# Patient Record
Sex: Female | Born: 1990
Health system: Southern US, Community
[De-identification: ages and names within clinical notes are randomized; demographics above are authoritative.]

## PROBLEM LIST (undated history)

## (undated) DIAGNOSIS — D649 Anemia, unspecified: Secondary | ICD-10-CM

## (undated) DIAGNOSIS — T7840XA Allergy, unspecified, initial encounter: Secondary | ICD-10-CM

## (undated) HISTORY — DX: Allergy, unspecified, initial encounter: T78.40XA

## (undated) HISTORY — DX: Anemia, unspecified: D64.9

---

## 2015-02-10 ENCOUNTER — Ambulatory Visit (INDEPENDENT_AMBULATORY_CARE_PROVIDER_SITE_OTHER): Admitting: Internal Medicine

## 2015-02-10 VITALS — BP 110/82 | HR 82 | Temp 98.1°F | Resp 20 | Ht 66.0 in | Wt 130.0 lb

## 2015-02-10 DIAGNOSIS — R059 Cough, unspecified: Secondary | ICD-10-CM

## 2015-02-10 DIAGNOSIS — R05 Cough: Secondary | ICD-10-CM

## 2015-02-10 DIAGNOSIS — J301 Allergic rhinitis due to pollen: Secondary | ICD-10-CM

## 2015-02-10 MED ORDER — CETIRIZINE HCL 10 MG PO TABS: 10.0000 mg | ORAL_TABLET | Freq: Every day | ORAL | Status: DC

## 2015-02-10 MED ORDER — MONTELUKAST SODIUM 10 MG PO TABS: 10.0000 mg | ORAL_TABLET | Freq: Every day | ORAL | Status: DC

## 2015-02-10 MED ORDER — PROMETHAZINE-DM 6.25-15 MG/5ML PO SYRP: 5.0000 mL | ORAL_SOLUTION | Freq: Four times a day (QID) | ORAL | Status: DC | PRN

## 2015-02-10 MED ORDER — FLUTICASONE PROPIONATE 50 MCG/ACT NA SUSP: NASAL | Status: DC

## 2015-02-10 NOTE — Progress Notes (Signed)
Subjective:  This chart was scribed for Tonye Pearson, MD by Charline Bills, ED Scribe. The patient was seen in room 11. Patient's care was started at 2:52 PM.   Patient ID: Linda Yates, female    DOB: 09-08-1991, 24 y.o.   MRN: [id number]  Chief Complaint  Patient presents with  . Cough    x 1 week  . Nasal Congestion    x 1 week   HPI HPI Comments: Linda Yates is a 24 y.o. female who presents to the [facility] complaining of gradually worsening productive cough with yellow mucous, worsened at night, for the past week. Pt reports h/o sneezing, congestion, chest tightness onset earlier today, intermittent HA. She denies nosebleeds, SOB, ear pain, eye itching, sinus pressure. No h/o asthma. Pt started Mucinex DM yesterday. Pt has tried Prednisone in the past which caused insomnia.   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  Past Medical History  Diagnosis Date  . Allergy    No current outpatient prescriptions on file prior to visit.   No current facility-administered medications on file prior to visit.   No Known Allergies  Review of Systems  HENT: Positive for congestion and sneezing. Negative for ear pain, nosebleeds and sinus pressure.   Eyes: Negative for itching.  Respiratory: Positive for cough and chest tightness. Negative for shortness of breath.   Neurological: Positive for headaches.      Objective:   Physical Exam  Constitutional: She is oriented to person, place, and time. She appears well-developed and well-nourished. No distress.  HENT:  Head: Normocephalic and atraumatic.  Right Ear: Tympanic membrane and ear canal normal.  Left Ear: Tympanic membrane and ear canal normal.  Nose: Mucosal edema present.  Mouth/Throat: Oropharynx is clear and moist.  Nares: Swollen irritated turbinates with copious discharge.   Eyes: EOM are normal. Right conjunctiva is injected. Left  conjunctiva is injected.  Neck: Neck supple.  Cardiovascular: Normal rate, regular rhythm and normal heart sounds.   No murmur heard. Pulmonary/Chest: Effort normal and breath sounds normal.  Musculoskeletal: Normal range of motion.  Lymphadenopathy:    She has no cervical adenopathy.  Neurological: She is alert and oriented to person, place, and time.  Skin: Skin is warm and dry.  Psychiatric: She has a normal mood and affect. Her behavior is normal.  Nursing note and vitals reviewed. BP 110/82 mmHg  Pulse 82  Temp(Src) 98.1 F (36.7 C) (Oral)  Resp 20  Ht  (1.676 m)  Wt 130 lb (58.968 kg)  BMI 20.99 kg/m2  SpO2 100%  LMP 01/20/2015    Assessment & Plan:   I have completed the patient encounter in its entirety as documented by the scribe, with editing by me where necessary. Linda Yates, M.D. Allergic rhinitis due to pollen  Cough  Meds ordered this encounter  Medications  . cetirizine (ZYRTEC) 10 MG tablet    Sig: Take 1 tablet (10 mg total) by mouth daily.    Dispense:  30 tablet    Refill:  11  . montelukast (SINGULAIR) 10 MG tablet    Sig: Take 1 tablet (10 mg total) by mouth at bedtime.    Dispense:  30 tablet    Refill:  2  . fluticasone (FLONASE) 50 MCG/ACT nasal spray    Sig: 1 spray in each nostril twice a day for the next 2-3 months    Dispense:  16 g    Refill:  6  . promethazine-dextromethorphan (PROMETHAZINE-DM) 6.25-15 MG/5ML syrup    Sig: Take 5 mLs by mouth 4 (four) times daily as needed for cough.    Dispense:  118 mL    Refill:  0   Follow-up in one month to reevaluate nasal exam and control of allergies

## 2015-02-10 NOTE — Patient Instructions (Signed)
You may also use Sudafed 12 hour which the pharmacist keeps behind the counter for you to sign for. This will relieve nasal congestion and can be used at breakfast and dinner unless it also causes you to feel restless or have a rapid heartbeat as you try to go to bed. You might consider using "Netty Pot" nasal saline administration-information is available on the web.

## 2015-02-15 ENCOUNTER — Other Ambulatory Visit: Payer: Self-pay | Admitting: Internal Medicine

## 2015-02-16 ENCOUNTER — Encounter (HOSPITAL_COMMUNITY): Payer: Self-pay | Admitting: *Deleted

## 2015-02-16 ENCOUNTER — Emergency Department (HOSPITAL_COMMUNITY)
Admission: EM | Admit: 2015-02-16 | Discharge: 2015-02-16 | Disposition: A | Discharge status: Home / Self Care | Attending: Emergency Medicine | Admitting: Emergency Medicine

## 2015-02-16 ENCOUNTER — Emergency Department (HOSPITAL_COMMUNITY)

## 2015-02-16 DIAGNOSIS — B349 Viral infection, unspecified: Secondary | ICD-10-CM | POA: Diagnosis not present

## 2015-02-16 DIAGNOSIS — Z79899 Other long term (current) drug therapy: Secondary | ICD-10-CM | POA: Insufficient documentation

## 2015-02-16 DIAGNOSIS — R05 Cough: Secondary | ICD-10-CM | POA: Diagnosis present

## 2015-02-16 LAB — BASIC METABOLIC PANEL
Anion gap: 7 (ref 5–15)
BUN: 12 mg/dL (ref 6–23)
CALCIUM: 9 mg/dL (ref 8.4–10.5)
CO2: 24 mmol/L (ref 19–32)
CREATININE: 0.68 mg/dL (ref 0.50–1.10)
Chloride: 106 mmol/L (ref 96–112)
GFR calc Af Amer: >90 mL/min (ref >90)
GFR calc non Af Amer: >90 mL/min (ref >90)
GLUCOSE: 92 mg/dL (ref 70–99)
POTASSIUM: 3.6 mmol/L (ref 3.5–5.1)
SODIUM: 137 mmol/L (ref 135–145)

## 2015-02-16 LAB — CBC
HCT: 34.8 % — ABNORMAL LOW (ref 36.0–46.0)
Hemoglobin: 10.7 g/dL — ABNORMAL LOW (ref 12.0–15.0)
MCH: 21.7 pg — AB (ref 26.0–34.0)
MCHC: 30.7 g/dL (ref 30.0–36.0)
MCV: 70.6 fL — ABNORMAL LOW (ref 78.0–100.0)
PLATELETS: 359 10*3/uL (ref 150–400)
RBC: 4.93 MIL/uL (ref 3.87–5.11)
RDW: 14.2 % (ref 11.5–15.5)
WBC: 11.1 10*3/uL — AB (ref 4.0–10.5)

## 2015-02-16 MED ORDER — PROMETHAZINE-DM 6.25-15 MG/5ML PO SYRP: 5.0000 mL | ORAL_SOLUTION | Freq: Four times a day (QID) | ORAL | Status: DC | PRN

## 2015-02-16 NOTE — ED Provider Notes (Signed)
CSN: [id number]     Arrival date & time 02/16/15  1825 History   First MD Initiated Contact with Patient 02/16/15 1909     Chief Complaint  Patient presents with  . Cough  . 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 Patient reports for the past week she has had nasal congestion, teary eyes, runny nose, and nonproductive cough with associate shortness of breath. Cough worsening at nighttime. She was seen at an urgent care center several days ago for this complaint and was given symptomatic treatment. She has been using her cough medication which has helped but she has ran out of her cough medication. She also reported having dyspnea on exertion. Patient denies prior history of PE or DVT, no recent surgery, recent travel, prolonged bed rest, unilateral leg swelling or calf pain, active cancer.  She is a nonsmoker without any significant cardiac history. No history of asthma, denies wheezing.  Past Medical History  Diagnosis Date  . Allergy    History reviewed. No pertinent past surgical history. History reviewed. No pertinent family history. History  Substance Use Topics  . Smoking status: Never Smoker   . Smokeless tobacco: Not on file  . Alcohol Use: Not on file   OB History    No data available     Review of Systems  All other systems reviewed and are negative.     Allergies  Review of patient's allergies indicates no known allergies.  Home Medications   Prior to Admission medications   Medication Sig Start Date End Date Taking? Authorizing Provider  cetirizine (ZYRTEC) 10 MG tablet Take 1 tablet (10 mg total) by mouth daily. 02/10/15  Yes Tonye Pearson, MD  fluticasone [facility]) 50 MCG/ACT nasal spray 1 spray in each nostril twice a day for the next 2-3 months 02/10/15  Yes Tonye Pearson, MD   montelukast (SINGULAIR) 10 MG tablet Take 1 tablet (10 mg total) by mouth at bedtime. 02/10/15  Yes Tonye Pearson, MD  promethazine-dextromethorphan (PROMETHAZINE-DM) 6.25-15 MG/5ML syrup Take 5 mLs by mouth 4 (four) times daily as needed for cough. Patient not taking: Reported on 02/16/2015 02/10/15   Tonye Pearson, MD   BP 122/77 mmHg  Pulse 89  Temp(Src) 98.1 F (36.7 C) (Oral)  Resp 16  SpO2 100%  LMP 01/20/2015 Physical Exam  Constitutional: She appears well-developed and well-nourished. No distress.  HENT:  Head: Atraumatic.  Right Ear: External ear normal.  Left Ear: External ear normal.  Nose: Nose normal.  Mouth/Throat: Oropharynx is clear and moist. No oropharyngeal exudate.  Eyes: Conjunctivae are normal.  Neck: Neck supple.  No nuchal rigidity  Cardiovascular: Normal rate, regular rhythm and intact distal pulses.  Exam reveals no gallop and no friction rub.   No murmur heard. Pulmonary/Chest: Effort normal and breath sounds normal. No respiratory distress. She has no wheezes.  Abdominal: Soft. There is no tenderness.  Musculoskeletal: She exhibits no edema.  Neurological: She is alert.  Skin: No rash noted.  Psychiatric: She has a normal mood and affect.  Nursing note and vitals reviewed.   ED Course  Procedures (including critical care time)  7:38 PM Pt with URI sxs.  PERC negative, doubt PE.  sxs does not suggest ACS.  She is afebrile, VSS.    9:44 PM Chest x-ray and labs are reassuring. At this time I felt patient stable  for discharge. Patient will be receiving cough medication as it has helped in the past. Return precautions discussed.  Labs Review Labs Reviewed  CBC - Abnormal; Notable for the following:    WBC 11.1 (*)    Hemoglobin 10.7 (*)    HCT 34.8 (*)    MCV 70.6 (*)    MCH 21.7 (*)    All other components within normal limits  BASIC METABOLIC PANEL    Imaging Review Dg Chest 2 View (if Patient Has Fever And/or Copd)  02/16/2015    CLINICAL DATA:  Shortness of breath, cough, congestion  EXAM: CHEST  2 VIEW  COMPARISON:  None.  FINDINGS: The heart size and mediastinal contours are within normal limits. Both lungs are clear. The visualized skeletal structures are unremarkable.  IMPRESSION: No active cardiopulmonary disease.   Electronically Signed   By: Christiana PellantGretchen  Green M.D.   On: 02/16/2015 20:19     EKG Interpretation None      MDM   Final diagnoses:  Viral syndrome    BP 95/48 mmHg  Pulse 83  Temp(Src) 98.1 F (36.7 C) (Oral)  Resp 18  SpO2 99%  LMP 01/20/2015  I have reviewed nursing notes and vital signs. I personally reviewed the imaging tests through PACS system  I reviewed available ER/hospitalization records thought the EMR     Fayrene HelperBowie Cary Lothrop, PA-C 02/16/15 2146  Gilda Creasehristopher J Pollina, MD 02/16/15 2151

## 2015-02-16 NOTE — Discharge Instructions (Signed)

## 2015-02-16 NOTE — ED Notes (Signed)
Pt reports she was seen and treated in ED on 4/5 for cough, reports she is still having cough and now having SOB when walking long distances. Denies pain. Able to speak in full sentences.

## 2015-02-17 ENCOUNTER — Other Ambulatory Visit: Payer: Self-pay | Admitting: Internal Medicine

## 2016-02-17 ENCOUNTER — Other Ambulatory Visit: Payer: Self-pay | Admitting: Internal Medicine

## 2016-06-08 IMAGING — CR DG CHEST 2V
2 series · 2 of 2 positions shown · non-contrast
Comparison: None.

CLINICAL DATA: Shortness of breath, cough, congestion

EXAM:
CHEST  2 VIEW

[w chest pa]
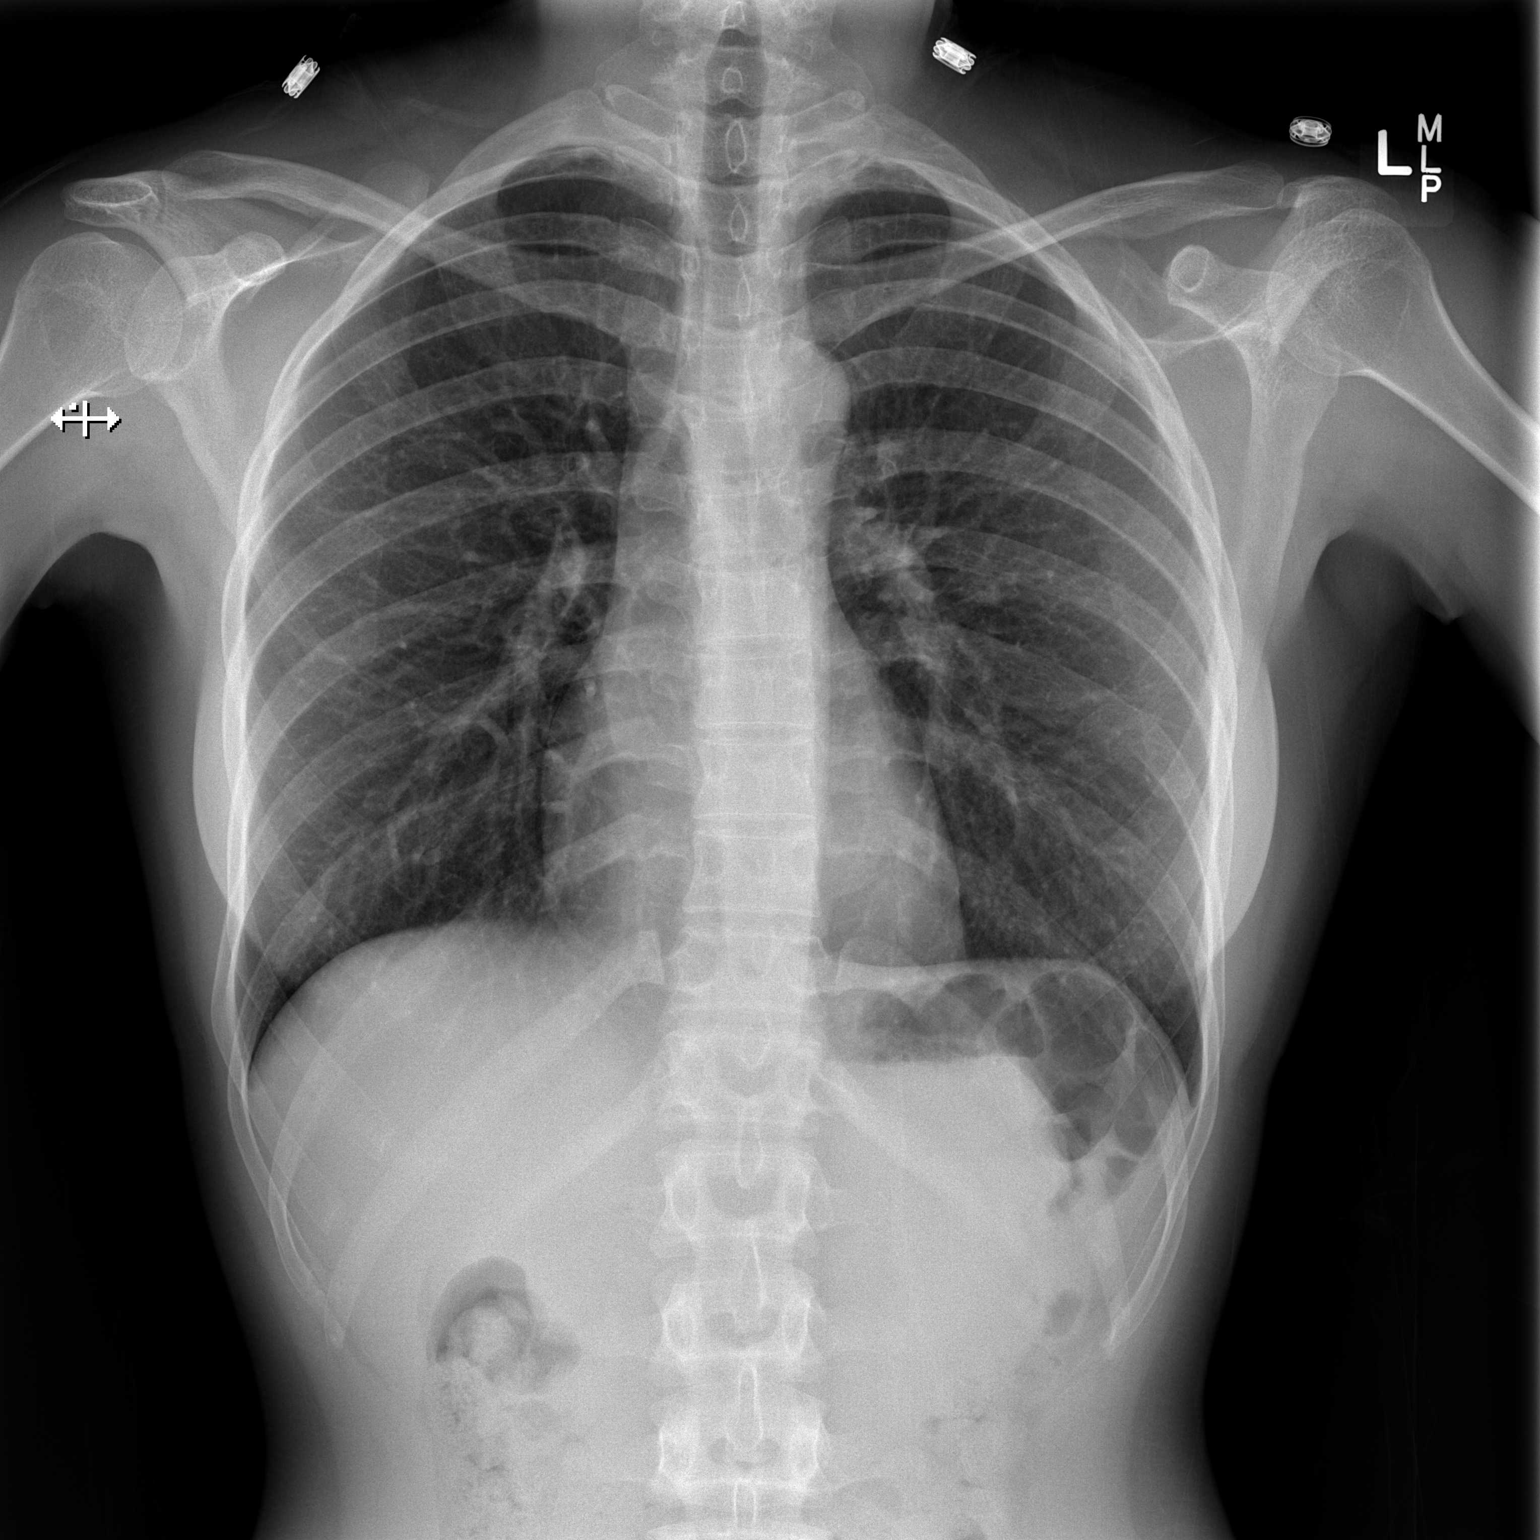

[w chest lat]
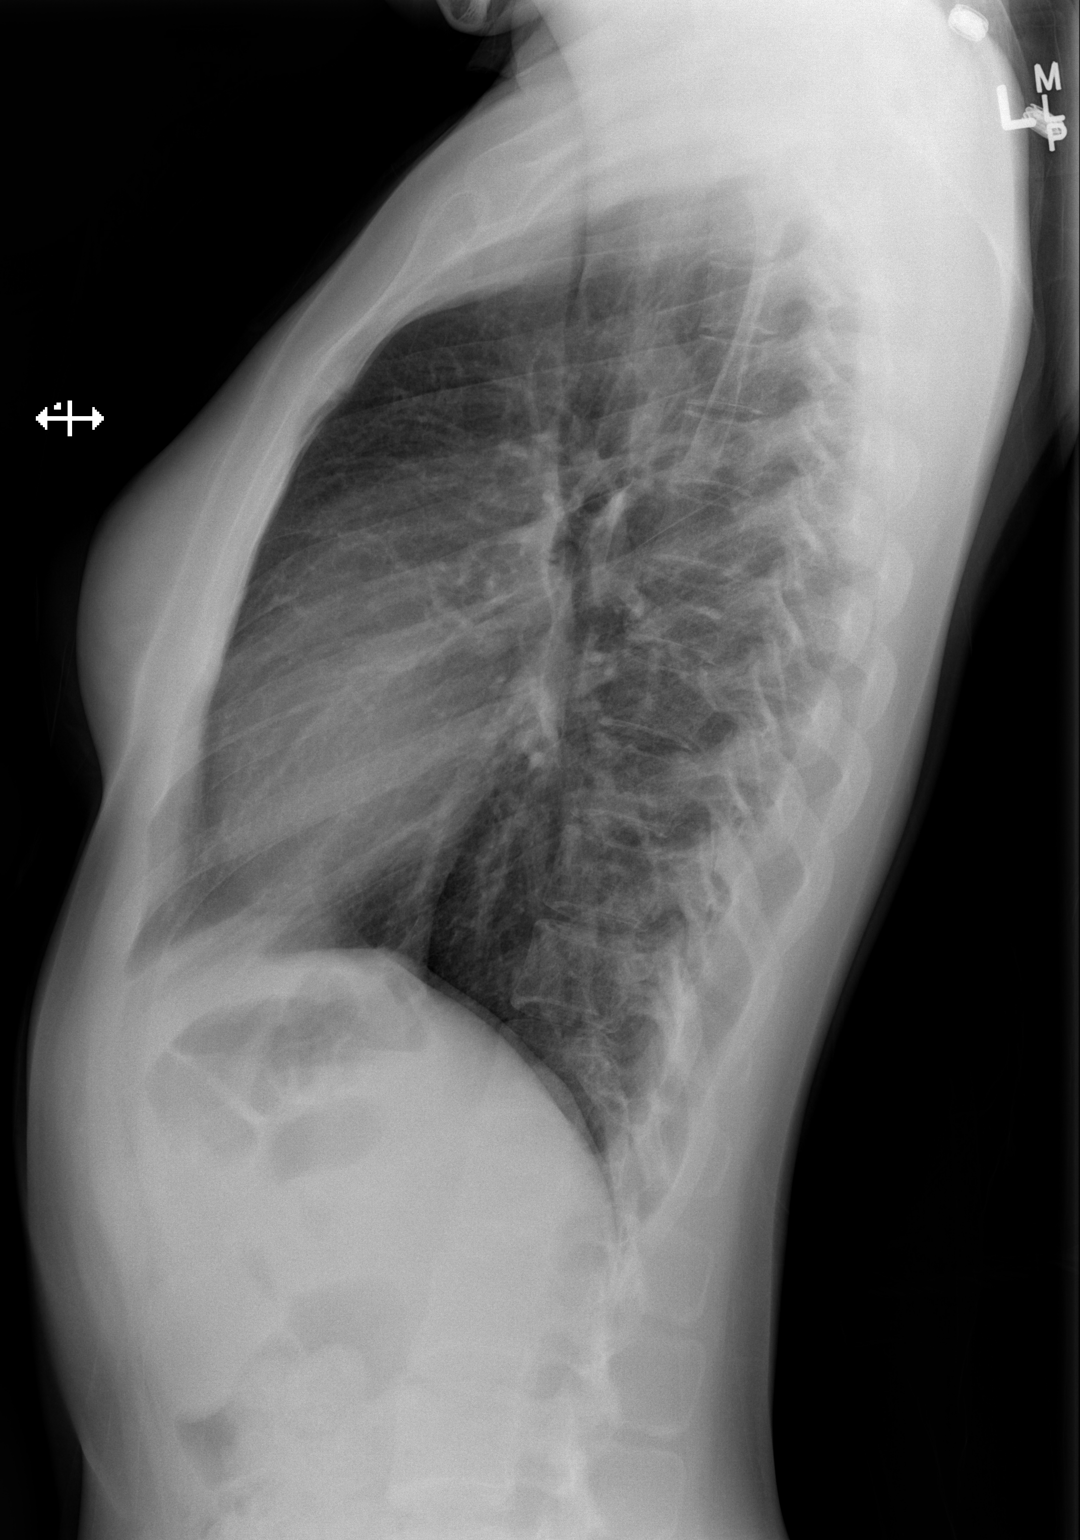

[2 of 2 positions shown; findings below may reference images not displayed]

FINDINGS: The heart size and mediastinal contours are within normal limits.
Both lungs are clear. The visualized skeletal structures are
unremarkable.
IMPRESSION: No active cardiopulmonary disease.

## 2016-10-26 ENCOUNTER — Ambulatory Visit (INDEPENDENT_AMBULATORY_CARE_PROVIDER_SITE_OTHER): Payer: BLUE CROSS/BLUE SHIELD | Admitting: Family Medicine

## 2016-10-26 VITALS — BP 110/70 | HR 79 | Temp 98.4°F | Resp 18 | Ht 65.0 in | Wt 126.2 lb

## 2016-10-26 DIAGNOSIS — R69 Illness, unspecified: Secondary | ICD-10-CM

## 2016-10-26 DIAGNOSIS — J111 Influenza due to unidentified influenza virus with other respiratory manifestations: Secondary | ICD-10-CM

## 2016-10-26 NOTE — Progress Notes (Signed)
Subjective:  By signing my name below, I, Raven Small, attest that this documentation has been prepared under the direction and in the presence of Meredith StaggersJeffrey Cecil Vandyke, MD.  Electronically Signed: Andrew Auaven Small, ED Scribe. 10/26/2016. 3:05 PM.   Patient ID: Linda Yates, female    DOB: 06/29/1991, 25 y.o.   MRN: [id number]  HPI Chief Complaint  Patient presents with  . Sinusitis    HPI Comments: Linda Yates is a 25 y.o. female who presents to the [facility] complaining of sinus congestion. Pt states symptoms started 2 days with HA and subjective fever, but did not measure a fever. Yesterday she began to feel feverish with body aches. Today she reports sweats this morning, HA sore throat, and decreased appetite, but no fever. Pt has tried alka seltzer cold and otc sinus medication without relief. She denies sick contacts. She deneis cough rhinorrhea, ear pain, decreased appetite, CP, SOB, hematuria, emesis, diarrhea and abdominal pain. Pt received fly shot 2 months ago.   Pt reports palpitation of heart fluttering in the past. She was seen by cardiologist.   Pt works as a Education officer, environmentalretail coordinator.   Patient Active Problem List   Diagnosis Date Noted  . Allergic rhinitis due to pollen 02/10/2015   Past Medical History:  Diagnosis Date  . Allergy    No past surgical history on file. Allergies  Allergen Reactions  . Prednisone    Prior to Admission medications   Not on File   Social History   Social History  . Marital status: Single    Spouse name: N/A  . Number of children: N/A  . Years of education: N/A   Occupational History  . Not on file.   Social History Main Topics  . Smoking status: Never Smoker  . Smokeless tobacco: Not on file  . Alcohol use Not on file  . Drug use: Unknown  . Sexual activity: Not on file   Other Topics Concern  . Not on file   Social History Narrative  . No narrative on file      Review of Systems  Constitutional:  Positive for chills, fatigue and fever. Negative for appetite change.  HENT: Positive for sore throat. Negative for ear pain and rhinorrhea.   Respiratory: Negative for cough and shortness of breath.   Cardiovascular: Negative for chest pain.  Gastrointestinal: Positive for nausea. Negative for abdominal pain and vomiting.  Genitourinary: Negative for hematuria.  Neurological: Positive for headaches.       Objective:   Physical Exam  Constitutional: She is oriented to person, place, and time. She appears well-developed and well-nourished. No distress.  HENT:  Head: Normocephalic and atraumatic.  Right Ear: Hearing, tympanic membrane, external ear and ear canal normal.  Left Ear: Hearing, tympanic membrane, external ear and ear canal normal.  Nose: Nose normal. Right sinus exhibits no maxillary sinus tenderness and no frontal sinus tenderness. Left sinus exhibits no maxillary sinus tenderness and no frontal sinus tenderness.  Mouth/Throat: Oropharynx is clear and moist. No oropharyngeal exudate.  Eyes: Conjunctivae and EOM are normal. Pupils are equal, round, and reactive to light.  Neck: Neck supple. No tracheal deviation present.  Cardiovascular: Normal rate, regular rhythm and intact distal pulses.   Murmur ( possible grade 1 -2, faint ) heard. Pulmonary/Chest: Effort normal and breath sounds normal. No respiratory distress. She has no wheezes. She has no rhonchi.  Musculoskeletal: Normal range of motion.  Neurological: She is alert and oriented to person, place,  and time.  Skin: Skin is warm and dry. No rash noted.  Psychiatric: She has a normal mood and affect. Her behavior is normal.  Nursing note and vitals reviewed.   Vitals:   10/26/16 1433  BP: 110/70  Pulse: 79  Resp: 18  Temp: 98.4 F (36.9 C)  TempSrc: Oral  SpO2: 100%  Weight: 126 lb 3.2 oz (57.2 kg)  Height: 5\' 5"  (1.651 m)    Assessment & Plan:   Linda Yates is a 25 y.o. female Influenza-like illness  -  Suspected viral syndrome based on current symptoms, and with myalgias/HA, influenza or influenza-like illness possible. Currently at approximately 48 hours symptoms, discussed Tamiflu, but unlikely to have significant benefit at this time. She decided against that medication  -Symptomatic care and RTC precautions discussed.  No orders of the defined types were placed in this encounter.  Patient Instructions    Saline nasal spray if needed for nasal congestion, over the counter mucinex or mucinex DM as needed for cough, tylenol or ibuprofen over the counter for fever and body aches, and drink plenty of fluids. Other information as in instructions below.  Return to the clinic or go to the nearest emergency room if any of your symptoms worsen or new symptoms occur.  If you do have fever, should avoid returning to work until fever free for 24 hours off of fever reducing medications.    Viral Illness, Adult Viruses are tiny germs that can get into a person's body and cause illness. There are many different types of viruses, and they cause many types of illness. Viral illnesses can range from mild to severe. They can affect various parts of the body. Common illnesses that are caused by a virus include colds and the flu. Viral illnesses also include serious conditions such as HIV/AIDS (human immunodeficiency virus/acquired immunodeficiency syndrome). A few viruses have been linked to certain cancers. What are the causes? Many types of viruses can cause illness. Viruses invade cells in your body, multiply, and cause the infected cells to malfunction or die. When the cell dies, it releases more of the virus. When this happens, you develop symptoms of the illness, and the virus continues to spread to other cells. If the virus takes over the function of the cell, it can cause the cell to divide and grow out of control, as is the case when a virus causes cancer. Different viruses get into the body in different  ways. You can get a virus by:  Swallowing food or water that is contaminated with the virus.  Breathing in droplets that have been coughed or sneezed into the air by an infected person.  Touching a surface that has been contaminated with the virus and then touching your eyes, nose, or mouth.  Being bitten by an insect or animal that carries the virus.  Having sexual contact with a person who is infected with the virus.  Being exposed to blood or fluids that contain the virus, either through an open cut or during a transfusion. If a virus enters your body, your body's defense system (immune system) will try to fight the virus. You may be at higher risk for a viral illness if your immune system is weak. What are the signs or symptoms? Symptoms vary depending on the type of virus and the location of the cells that it invades. Common symptoms of the main types of viral illnesses include: Cold and flu viruses  Fever.  Headache.  Sore throat.  Muscle  aches.  Nasal congestion.  Cough. Digestive system (gastrointestinal) viruses  Fever.  Abdominal pain.  Nausea.  Diarrhea. Liver viruses (hepatitis)  Loss of appetite.  Tiredness.  Yellowing of the skin (jaundice). Brain and spinal cord viruses  Fever.  Headache.  Stiff neck.  Nausea and vomiting.  Confusion or sleepiness. Skin viruses  Warts.  Itching.  Rash. Sexually transmitted viruses  Discharge.  Swelling.  Redness.  Rash. How is this treated? Viruses can be difficult to treat because they live within cells. Antibiotic medicines do not treat viruses because these drugs do not get inside cells. Treatment for a viral illness may include:  Resting and drinking plenty of fluids.  Medicines to relieve symptoms. These can include over-the-counter medicine for pain and fever, medicines for cough or congestion, and medicines to relieve diarrhea.  Antiviral medicines. These drugs are available only for  certain types of viruses. They may help reduce flu symptoms if taken early. There are also many antiviral medicines for hepatitis and HIV/AIDS. Some viral illnesses can be prevented with vaccinations. A common example is the flu shot. Follow these instructions at home: Medicines  Take over-the-counter and prescription medicines only as told by your health care provider.  If you were prescribed an antiviral medicine, take it as told by your health care provider. Do not stop taking the medicine even if you start to feel better.  Be aware of when antibiotics are needed and when they are not needed. Antibiotics do not treat viruses. If your health care provider thinks that you may have a bacterial infection as well as a viral infection, you may get an antibiotic.  Do not ask for an antibiotic prescription if you have been diagnosed with a viral illness. That will not make your illness go away faster.  Frequently taking antibiotics when they are not needed can lead to antibiotic resistance. When this develops, the medicine no longer works against the bacteria that it normally fights. General instructions  Drink enough fluids to keep your urine clear or pale yellow.  Rest as much as possible.  Return to your normal activities as told by your health care provider. Ask your health care provider what activities are safe for you.  Keep all follow-up visits as told by your health care provider. This is important. How is this prevented? Take these actions to reduce your risk of viral infection:  Eat a healthy diet and get enough rest.  Wash your hands often with soap and water. This is especially important when you are in public places. If soap and water are not available, use hand sanitizer.  Avoid close contact with friends and family who have a viral illness.  If you travel to areas where viral gastrointestinal infection is common, avoid drinking water or eating raw food.  Keep your  immunizations up to date. Get a flu shot every year as told by your health care provider.  Do not share toothbrushes, nail clippers, razors, or needles with other people.  Always practice safe sex. Contact a health care provider if:  You have symptoms of a viral illness that do not go away.  Your symptoms come back after going away.  Your symptoms get worse. Get help right away if:  You have trouble breathing.  You have a severe headache or a stiff neck.  You have severe vomiting or abdominal pain. This information is not intended to replace advice given to you by your health care provider. Make sure you discuss any questions  you have with your health care provider. Document Released: 03/04/2016 Document Revised: 04/06/2016 Document Reviewed: 03/04/2016 Elsevier Interactive Patient Education  2017 [employer]  Influenza, Adult Influenza, more commonly known as "the flu," is a viral infection that primarily affects the respiratory tract. The respiratory tract includes organs that help you breathe, such as the lungs, nose, and throat. The flu causes many common cold symptoms, as well as a high fever and body aches. The flu spreads easily from person to person (is contagious). Getting a flu shot (influenza vaccination) every year is the best way to prevent influenza. What are the causes? Influenza is caused by a virus. You can catch the virus by:  Breathing in droplets from an infected person's cough or sneeze.  Touching something that was recently contaminated with the virus and then touching your mouth, nose, or eyes. What increases the risk? The following factors may make you more likely to get the flu:  Not cleaning your hands frequently with soap and water or alcohol-based hand sanitizer.  Having close contact with many people during cold and flu season.  Touching your mouth, eyes, or nose without washing or sanitizing your hands first.  Not drinking enough fluids or not  eating a healthy diet.  Not getting enough sleep or exercise.  Being under a high amount of stress.  Not getting a yearly (annual) flu shot. You may be at a higher risk of complications from the flu, such as a severe lung infection (pneumonia), if you:  Are over the age of 23.  Are pregnant.  Have a weakened disease-fighting system (immune system). You may have a weakened immune system if you:  Have HIV or AIDS.  Are undergoing chemotherapy.  Aretaking medicines that reduce the activity of (suppress) the immune system.  Have a long-term (chronic) illness, such as heart disease, kidney disease, diabetes, or lung disease.  Have a liver disorder.  Are obese.  Have anemia. What are the signs or symptoms? Symptoms of this condition typically last 4-10 days and may include:  Fever.  Chills.  Headache, body aches, or muscle aches.  Sore throat.  Cough.  Runny or congested nose.  Chest discomfort and cough.  Poor appetite.  Weakness or tiredness (fatigue).  Dizziness.  Nausea or vomiting. How is this diagnosed? This condition may be diagnosed based on your medical history and a physical exam. Your health care provider may do a nose or throat swab test to confirm the diagnosis. How is this treated? If influenza is detected early, you can be treated with antiviral medicine that can reduce the length of your illness and the severity of your symptoms. This medicine may be given by mouth (orally) or through an IV tube that is inserted in one of your veins. The goal of treatment is to relieve symptoms by taking care of yourself at home. This may include taking over-the-counter medicines, drinking plenty of fluids, and adding humidity to the air in your home. In some cases, influenza goes away on its own. Severe influenza or complications from influenza may be treated in a hospital. Follow these instructions at home:  Take over-the-counter and prescription medicines only  as told by your health care provider.  Use a cool mist humidifier to add humidity to the air in your home. This can make breathing easier.  Rest as needed.  Drink enough fluid to keep your urine clear or pale yellow.  Cover your mouth and nose when you cough or sneeze.  Wash your  hands with soap and water often, especially after you cough or sneeze. If soap and water are not available, use hand sanitizer.  Stay home from work or school as told by your health care provider. Unless you are visiting your health care provider, try to avoid leaving home until your fever has been gone for 24 hours without the use of medicine.  Keep all follow-up visits as told by your health care provider. This is important. How is this prevented?  Getting an annual flu shot is the best way to avoid getting the flu. You may get the flu shot in late summer, fall, or winter. Ask your health care provider when you should get your flu shot.  Wash your hands often or use hand sanitizer often.  Avoid contact with people who are sick during cold and flu season.  Eat a healthy diet, drink plenty of fluids, get enough sleep, and exercise regularly. Contact a health care provider if:  You develop new symptoms.  You have:  Chest pain.  Diarrhea.  A fever.  Your cough gets worse.  You produce more mucus.  You feel nauseous or you vomit. Get help right away if:  You develop shortness of breath or difficulty breathing.  Your skin or nails turn a bluish color.  You have severe pain or stiffness in your neck.  You develop a sudden headache or sudden pain in your face or ear.  You cannot stop vomiting. This information is not intended to replace advice given to you by your health care provider. Make sure you discuss any questions you have with your health care provider. Document Released: 10/21/2000 Document Revised: 03/31/2016 Document Reviewed: 08/18/2015 Elsevier Interactive Patient Education  2017  [employer].   IF you received an x-ray today, you will receive an invoice from [facility]. Please contact [facility] at [phone] with questions or concerns regarding your invoice.   IF you received labwork today, you will receive an invoice from [place]. Please contact [facility] at [phone] with questions or concerns regarding your invoice.   Our billing staff will not be able to assist you with questions regarding bills from these companies.  You will be contacted with the lab results as soon as they are available. The fastest way to get your results is to activate your My Chart account. Instructions are located on the last page of this paperwork. If you have not heard from [place] regarding the results in 2 weeks, please contact this office.       I personally performed the services described in this documentation, which was scribed in my presence. The recorded information has been reviewed and considered, and addended by me as needed.   Signed,   Meredith Staggers, MD [facility].  10/26/16 3:22 PM

## 2016-10-26 NOTE — Patient Instructions (Addendum)
Saline nasal spray if needed for nasal congestion, over the counter mucinex or mucinex DM as needed for cough, tylenol or ibuprofen over the counter for fever and body aches, and drink plenty of fluids. Other information as in instructions below.  Return to the clinic or go to the nearest emergency room if any of your symptoms worsen or new symptoms occur.  If you do have fever, should avoid returning to work until fever free for 24 hours off of fever reducing medications.    Viral Illness, Adult Viruses are tiny germs that can get into a person's body and cause illness. There are many different types of viruses, and they cause many types of illness. Viral illnesses can range from mild to severe. They can affect various parts of the body. Common illnesses that are caused by a virus include colds and the flu. Viral illnesses also include serious conditions such as HIV/AIDS (human immunodeficiency virus/acquired immunodeficiency syndrome). A few viruses have been linked to certain cancers. What are the causes? Many types of viruses can cause illness. Viruses invade cells in your body, multiply, and cause the infected cells to malfunction or die. When the cell dies, it releases more of the virus. When this happens, you develop symptoms of the illness, and the virus continues to spread to other cells. If the virus takes over the function of the cell, it can cause the cell to divide and grow out of control, as is the case when a virus causes cancer. Different viruses get into the body in different ways. You can get a virus by:  Swallowing food or water that is contaminated with the virus.  Breathing in droplets that have been coughed or sneezed into the air by an infected person.  Touching a surface that has been contaminated with the virus and then touching your eyes, nose, or mouth.  Being bitten by an insect or animal that carries the virus.  Having sexual contact with a person who is infected  with the virus.  Being exposed to blood or fluids that contain the virus, either through an open cut or during a transfusion. If a virus enters your body, your body's defense system (immune system) will try to fight the virus. You may be at higher risk for a viral illness if your immune system is weak. What are the signs or symptoms? Symptoms vary depending on the type of virus and the location of the cells that it invades. Common symptoms of the main types of viral illnesses include: Cold and flu viruses  Fever.  Headache.  Sore throat.  Muscle aches.  Nasal congestion.  Cough. Digestive system (gastrointestinal) viruses  Fever.  Abdominal pain.  Nausea.  Diarrhea. Liver viruses (hepatitis)  Loss of appetite.  Tiredness.  Yellowing of the skin (jaundice). Brain and spinal cord viruses  Fever.  Headache.  Stiff neck.  Nausea and vomiting.  Confusion or sleepiness. Skin viruses  Warts.  Itching.  Rash. Sexually transmitted viruses  Discharge.  Swelling.  Redness.  Rash. How is this treated? Viruses can be difficult to treat because they live within cells. Antibiotic medicines do not treat viruses because these drugs do not get inside cells. Treatment for a viral illness may include:  Resting and drinking plenty of fluids.  Medicines to relieve symptoms. These can include over-the-counter medicine for pain and fever, medicines for cough or congestion, and medicines to relieve diarrhea.  Antiviral medicines. These drugs are available only for certain types of viruses. They may  help reduce flu symptoms if taken early. There are also many antiviral medicines for hepatitis and HIV/AIDS. Some viral illnesses can be prevented with vaccinations. A common example is the flu shot. Follow these instructions at home: Medicines  Take over-the-counter and prescription medicines only as told by your health care provider.  If you were prescribed an antiviral  medicine, take it as told by your health care provider. Do not stop taking the medicine even if you start to feel better.  Be aware of when antibiotics are needed and when they are not needed. Antibiotics do not treat viruses. If your health care provider thinks that you may have a bacterial infection as well as a viral infection, you may get an antibiotic.  Do not ask for an antibiotic prescription if you have been diagnosed with a viral illness. That will not make your illness go away faster.  Frequently taking antibiotics when they are not needed can lead to antibiotic resistance. When this develops, the medicine no longer works against the bacteria that it normally fights. General instructions  Drink enough fluids to keep your urine clear or pale yellow.  Rest as much as possible.  Return to your normal activities as told by your health care provider. Ask your health care provider what activities are safe for you.  Keep all follow-up visits as told by your health care provider. This is important. How is this prevented? Take these actions to reduce your risk of viral infection:  Eat a healthy diet and get enough rest.  Wash your hands often with soap and water. This is especially important when you are in public places. If soap and water are not available, use hand sanitizer.  Avoid close contact with friends and family who have a viral illness.  If you travel to areas where viral gastrointestinal infection is common, avoid drinking water or eating raw food.  Keep your immunizations up to date. Get a flu shot every year as told by your health care provider.  Do not share toothbrushes, nail clippers, razors, or needles with other people.  Always practice safe sex. Contact a health care provider if:  You have symptoms of a viral illness that do not go away.  Your symptoms come back after going away.  Your symptoms get worse. Get help right away if:  You have trouble  breathing.  You have a severe headache or a stiff neck.  You have severe vomiting or abdominal pain. This information is not intended to replace advice given to you by your health care provider. Make sure you discuss any questions you have with your health care provider. Document Released: 03/04/2016 Document Revised: 04/06/2016 Document Reviewed: 03/04/2016 Elsevier Interactive Patient Education  2017 [employer]  Influenza, Adult Influenza, more commonly known as "the flu," is a viral infection that primarily affects the respiratory tract. The respiratory tract includes organs that help you breathe, such as the lungs, nose, and throat. The flu causes many common cold symptoms, as well as a high fever and body aches. The flu spreads easily from person to person (is contagious). Getting a flu shot (influenza vaccination) every year is the best way to prevent influenza. What are the causes? Influenza is caused by a virus. You can catch the virus by:  Breathing in droplets from an infected person's cough or sneeze.  Touching something that was recently contaminated with the virus and then touching your mouth, nose, or eyes. What increases the risk? The following factors may make  you more likely to get the flu:  Not cleaning your hands frequently with soap and water or alcohol-based hand sanitizer.  Having close contact with many people during cold and flu season.  Touching your mouth, eyes, or nose without washing or sanitizing your hands first.  Not drinking enough fluids or not eating a healthy diet.  Not getting enough sleep or exercise.  Being under a high amount of stress.  Not getting a yearly (annual) flu shot. You may be at a higher risk of complications from the flu, such as a severe lung infection (pneumonia), if you:  Are over the age of 23.  Are pregnant.  Have a weakened disease-fighting system (immune system). You may have a weakened immune system if  you:  Have HIV or AIDS.  Are undergoing chemotherapy.  Aretaking medicines that reduce the activity of (suppress) the immune system.  Have a long-term (chronic) illness, such as heart disease, kidney disease, diabetes, or lung disease.  Have a liver disorder.  Are obese.  Have anemia. What are the signs or symptoms? Symptoms of this condition typically last 4-10 days and may include:  Fever.  Chills.  Headache, body aches, or muscle aches.  Sore throat.  Cough.  Runny or congested nose.  Chest discomfort and cough.  Poor appetite.  Weakness or tiredness (fatigue).  Dizziness.  Nausea or vomiting. How is this diagnosed? This condition may be diagnosed based on your medical history and a physical exam. Your health care provider may do a nose or throat swab test to confirm the diagnosis. How is this treated? If influenza is detected early, you can be treated with antiviral medicine that can reduce the length of your illness and the severity of your symptoms. This medicine may be given by mouth (orally) or through an IV tube that is inserted in one of your veins. The goal of treatment is to relieve symptoms by taking care of yourself at home. This may include taking over-the-counter medicines, drinking plenty of fluids, and adding humidity to the air in your home. In some cases, influenza goes away on its own. Severe influenza or complications from influenza may be treated in a hospital. Follow these instructions at home:  Take over-the-counter and prescription medicines only as told by your health care provider.  Use a cool mist humidifier to add humidity to the air in your home. This can make breathing easier.  Rest as needed.  Drink enough fluid to keep your urine clear or pale yellow.  Cover your mouth and nose when you cough or sneeze.  Wash your hands with soap and water often, especially after you cough or sneeze. If soap and water are not available, use  hand sanitizer.  Stay home from work or school as told by your health care provider. Unless you are visiting your health care provider, try to avoid leaving home until your fever has been gone for 24 hours without the use of medicine.  Keep all follow-up visits as told by your health care provider. This is important. How is this prevented?  Getting an annual flu shot is the best way to avoid getting the flu. You may get the flu shot in late summer, fall, or winter. Ask your health care provider when you should get your flu shot.  Wash your hands often or use hand sanitizer often.  Avoid contact with people who are sick during cold and flu season.  Eat a healthy diet, drink plenty of fluids, get enough  sleep, and exercise regularly. Contact a health care provider if:  You develop new symptoms.  You have:  Chest pain.  Diarrhea.  A fever.  Your cough gets worse.  You produce more mucus.  You feel nauseous or you vomit. Get help right away if:  You develop shortness of breath or difficulty breathing.  Your skin or nails turn a bluish color.  You have severe pain or stiffness in your neck.  You develop a sudden headache or sudden pain in your face or ear.  You cannot stop vomiting. This information is not intended to replace advice given to you by your health care provider. Make sure you discuss any questions you have with your health care provider. Document Released: 10/21/2000 Document Revised: 03/31/2016 Document Reviewed: 08/18/2015 Elsevier Interactive Patient Education  2017 [employer].   IF you received an x-ray today, you will receive an invoice from [facility]. Please contact [facility] at [phone] with questions or concerns regarding your invoice.   IF you received labwork today, you will receive an invoice from [facility]. Please contact [facility] at [phone] with questions or concerns regarding your invoice.   Our billing staff  will not be able to assist you with questions regarding bills from these companies.  You will be contacted with the lab results as soon as they are available. The fastest way to get your results is to activate your My Chart account. Instructions are located on the last page of this paperwork. If you have not heard from [place] regarding the results in 2 weeks, please contact this office.

## 2017-02-02 ENCOUNTER — Ambulatory Visit (INDEPENDENT_AMBULATORY_CARE_PROVIDER_SITE_OTHER): Payer: BLUE CROSS/BLUE SHIELD | Admitting: Emergency Medicine

## 2017-02-02 VITALS — BP 111/69 | HR 74 | Temp 98.5°F | Resp 16 | Ht 65.0 in | Wt 130.8 lb

## 2017-02-02 DIAGNOSIS — R6889 Other general symptoms and signs: Secondary | ICD-10-CM | POA: Diagnosis not present

## 2017-02-02 DIAGNOSIS — B349 Viral infection, unspecified: Secondary | ICD-10-CM

## 2017-02-02 DIAGNOSIS — M791 Myalgia, unspecified site: Secondary | ICD-10-CM | POA: Insufficient documentation

## 2017-02-02 LAB — POCT INFLUENZA A/B
INFLUENZA A, POC: NEGATIVE
INFLUENZA B, POC: NEGATIVE

## 2017-02-02 MED ORDER — HYDROCODONE-ACETAMINOPHEN 5-325 MG PO TABS: 1.0000 | ORAL_TABLET | Freq: Four times a day (QID) | ORAL | 0 refills | Status: DC | PRN

## 2017-02-02 NOTE — Patient Instructions (Addendum)
   IF you received an x-ray today, you will receive an invoice from [facility]. Please contact  Radiology at [phone] with questions or concerns regarding your invoice.   IF you received labwork today, you will receive an invoice from [facility]. Please contact [facility] at [phone] with questions or concerns regarding your invoice.   Our billing staff will not be able to assist you with questions regarding bills from these companies.  You will be contacted with the lab results as soon as they are available. The fastest way to get your results is to activate your My Chart account. Instructions are located on the last page of this paperwork. If you have not heard from us regarding the results in 2 weeks, please contact this office.     Viral Illness, Adult Viruses are tiny germs that can get into a person's body and cause illness. There are many different types of viruses, and they cause many types of illness. Viral illnesses can range from mild to severe. They can affect various parts of the body. Common illnesses that are caused by a virus include colds and the flu. Viral illnesses also include serious conditions such as HIV/AIDS (human immunodeficiency virus/acquired immunodeficiency syndrome). A few viruses have been linked to certain cancers. What are the causes? Many types of viruses can cause illness. Viruses invade cells in your body, multiply, and cause the infected cells to malfunction or die. When the cell dies, it releases more of the virus. When this happens, you develop symptoms of the illness, and the virus continues to spread to other cells. If the virus takes over the function of the cell, it can cause the cell to divide and grow out of control, as is the case when a virus causes cancer. Different viruses get into the body in different ways. You can get a virus by:  Swallowing food or water that is contaminated with the virus.  Breathing in droplets  that have been coughed or sneezed into the air by an infected person.  Touching a surface that has been contaminated with the virus and then touching your eyes, nose, or mouth.  Being bitten by an insect or animal that carries the virus.  Having sexual contact with a person who is infected with the virus.  Being exposed to blood or fluids that contain the virus, either through an open cut or during a transfusion. If a virus enters your body, your body's defense system (immune system) will try to fight the virus. You may be at higher risk for a viral illness if your immune system is weak. What are the signs or symptoms? Symptoms vary depending on the type of virus and the location of the cells that it invades. Common symptoms of the main types of viral illnesses include: Cold and flu viruses   Fever.  Headache.  Sore throat.  Muscle aches.  Nasal congestion.  Cough. Digestive system (gastrointestinal) viruses   Fever.  Abdominal pain.  Nausea.  Diarrhea. Liver viruses (hepatitis)   Loss of appetite.  Tiredness.  Yellowing of the skin (jaundice). Brain and spinal cord viruses   Fever.  Headache.  Stiff neck.  Nausea and vomiting.  Confusion or sleepiness. Skin viruses   Warts.  Itching.  Rash. Sexually transmitted viruses   Discharge.  Swelling.  Redness.  Rash. How is this treated? Viruses can be difficult to treat because they live within cells. Antibiotic medicines do not treat viruses because these drugs do not get inside cells.   Treatment for a viral illness may include:  Resting and drinking plenty of fluids.  Medicines to relieve symptoms. These can include over-the-counter medicine for pain and fever, medicines for cough or congestion, and medicines to relieve diarrhea.  Antiviral medicines. These drugs are available only for certain types of viruses. They may help reduce flu symptoms if taken early. There are also many antiviral  medicines for hepatitis and HIV/AIDS. Some viral illnesses can be prevented with vaccinations. A common example is the flu shot. Follow these instructions at home: Medicines    Take over-the-counter and prescription medicines only as told by your health care provider.  If you were prescribed an antiviral medicine, take it as told by your health care provider. Do not stop taking the medicine even if you start to feel better.  Be aware of when antibiotics are needed and when they are not needed. Antibiotics do not treat viruses. If your health care provider thinks that you may have a bacterial infection as well as a viral infection, you may get an antibiotic.  Do not ask for an antibiotic prescription if you have been diagnosed with a viral illness. That will not make your illness go away faster.  Frequently taking antibiotics when they are not needed can lead to antibiotic resistance. When this develops, the medicine no longer works against the bacteria that it normally fights. General instructions   Drink enough fluids to keep your urine clear or pale yellow.  Rest as much as possible.  Return to your normal activities as told by your health care provider. Ask your health care provider what activities are safe for you.  Keep all follow-up visits as told by your health care provider. This is important. How is this prevented? Take these actions to reduce your risk of viral infection:  Eat a healthy diet and get enough rest.  Wash your hands often with soap and water. This is especially important when you are in public places. If soap and water are not available, use hand sanitizer.  Avoid close contact with friends and family who have a viral illness.  If you travel to areas where viral gastrointestinal infection is common, avoid drinking water or eating raw food.  Keep your immunizations up to date. Get a flu shot every year as told by your health care provider.  Do not share  toothbrushes, nail clippers, razors, or needles with other people.  Always practice safe sex. Contact a health care provider if:  You have symptoms of a viral illness that do not go away.  Your symptoms come back after going away.  Your symptoms get worse. Get help right away if:  You have trouble breathing.  You have a severe headache or a stiff neck.  You have severe vomiting or abdominal pain. This information is not intended to replace advice given to you by your health care provider. Make sure you discuss any questions you have with your health care provider. Document Released: 03/04/2016 Document Revised: 04/06/2016 Document Reviewed: 03/04/2016 Elsevier Interactive Patient Education  2017 [employer]   

## 2017-02-02 NOTE — Progress Notes (Signed)
Linda Yates 26 y.o.   Chief Complaint  Patient presents with  . Illness    body aches, ST, HA, hot/cold chills started yesterday     HISTORY OF PRESENT ILLNESS: This is a 26 y.o. female complaining of flu-like symptoms x 2-3 days along with generalized body aches.  Influenza  This is a new problem. The current episode started in the past 7 days. The problem occurs constantly. The problem has been gradually worsening. Associated symptoms include chills, headaches and myalgias. Pertinent negatives include no abdominal pain, arthralgias, chest pain, congestion, coughing, fatigue, fever, nausea, neck pain, rash, sore throat, swollen glands, urinary symptoms or vomiting. She has tried nothing for the symptoms.     Prior to Admission medications   Not on File    Allergies  Allergen Reactions  . Prednisone     Patient Active Problem List   Diagnosis Date Noted  . Allergic rhinitis due to pollen 02/10/2015    Past Medical History:  Diagnosis Date  . Allergy     History reviewed. No pertinent surgical history.  Social History   Social History  . Marital status: Single    Spouse name: N/A  . Number of children: N/A  . Years of education: N/A   Occupational History  . Not on file.   Social History Main Topics  . Smoking status: Never Smoker  . Smokeless tobacco: Never Used  . Alcohol use Not on file  . Drug use: Unknown  . Sexual activity: Not on file   Other Topics Concern  . Not on file   Social History Narrative  . No narrative on file    History reviewed. No pertinent family history.   Review of Systems  Constitutional: Positive for chills. Negative for fatigue and fever.  HENT: Negative for congestion, ear pain, nosebleeds, sinus pain and sore throat.   Eyes: Negative for blurred vision, double vision, discharge and redness.  Respiratory: Negative for cough, shortness of breath and wheezing.   Cardiovascular: Negative for chest pain, palpitations  and leg swelling.  Gastrointestinal: Negative for abdominal pain, constipation, diarrhea, nausea and vomiting.  Genitourinary: Negative for dysuria and hematuria.  Musculoskeletal: Positive for myalgias. Negative for arthralgias and neck pain.  Skin: Negative for rash.  Neurological: Positive for headaches. Negative for dizziness.  All other systems reviewed and are negative.  Vitals:   02/02/17 1218  BP: 111/69  Pulse: 74  Resp: 16  Temp: 98.5 F (36.9 C)    Physical Exam  Constitutional: She is oriented to person, place, and time. She appears well-developed and well-nourished.  HENT:  Head: Normocephalic and atraumatic.  Right Ear: External ear normal.  Left Ear: External ear normal.  Nose: Nose normal.  Mouth/Throat: Oropharynx is clear and moist. No oropharyngeal exudate.  Eyes: Conjunctivae and EOM are normal. Pupils are equal, round, and reactive to light.  Neck: Normal range of motion. Neck supple. No JVD present. No thyromegaly present.  Cardiovascular: Normal rate, regular rhythm and normal heart sounds.   No murmur heard. Pulmonary/Chest: Effort normal and breath sounds normal. She has no wheezes. She has no rales.  Abdominal: Soft. Bowel sounds are normal. She exhibits no distension. There is no tenderness.  Musculoskeletal: Normal range of motion.  Lymphadenopathy:    She has no cervical adenopathy.  Neurological: She is alert and oriented to person, place, and time. No sensory deficit. She exhibits normal muscle tone.  Skin: Skin is warm and dry. Capillary refill takes less than 2 seconds.  No rash noted.  Psychiatric: She has a normal mood and affect. Her behavior is normal.  Vitals reviewed.  Results for orders placed or performed in visit on 02/02/17 (from the past 24 hour(s))  POCT Influenza A/B     Status: None   Collection Time: 02/02/17  1:09 PM  Result Value Ref Range   Influenza A, POC Negative Negative   Influenza B, POC Negative Negative      ASSESSMENT & PLAN: [name] was seen today for illness.  Diagnoses and all orders for this visit:  Viral illness  Generalized muscle ache -     POCT Influenza A/B  Flu-like symptoms -     POCT Influenza A/B  Other orders -     HYDROcodone-acetaminophen (NORCO) 5-325 MG tablet; Take 1 tablet by mouth every 6 (six) hours as needed for moderate pain.    Patient Instructions       IF you received an x-ray today, you will receive an invoice from [facility]. Please contact [facility] at [phone] with questions or concerns regarding your invoice.   IF you received labwork today, you will receive an invoice from [facility]. Please contact [facility] at [phone] with questions or concerns regarding your invoice.   Our billing staff will not be able to assist you with questions regarding bills from these companies.  You will be contacted with the lab results as soon as they are available. The fastest way to get your results is to activate your My Chart account. Instructions are located on the last page of this paperwork. If you have not heard from [place] regarding the results in 2 weeks, please contact this office.      Viral Illness, Adult Viruses are tiny germs that can get into a person's body and cause illness. There are many different types of viruses, and they cause many types of illness. Viral illnesses can range from mild to severe. They can affect various parts of the body. Common illnesses that are caused by a virus include colds and the flu. Viral illnesses also include serious conditions such as HIV/AIDS (human immunodeficiency virus/acquired immunodeficiency syndrome). A few viruses have been linked to certain cancers. What are the causes? Many types of viruses can cause illness. Viruses invade cells in your body, multiply, and cause the infected cells to malfunction or die. When the cell dies, it releases more of the virus. When this happens, you  develop symptoms of the illness, and the virus continues to spread to other cells. If the virus takes over the function of the cell, it can cause the cell to divide and grow out of control, as is the case when a virus causes cancer. Different viruses get into the body in different ways. You can get a virus by:  Swallowing food or water that is contaminated with the virus.  Breathing in droplets that have been coughed or sneezed into the air by an infected person.  Touching a surface that has been contaminated with the virus and then touching your eyes, nose, or mouth.  Being bitten by an insect or animal that carries the virus.  Having sexual contact with a person who is infected with the virus.  Being exposed to blood or fluids that contain the virus, either through an open cut or during a transfusion. If a virus enters your body, your body's defense system (immune system) will try to fight the virus. You may be at higher risk for a viral illness if your immune system  is weak. What are the signs or symptoms? Symptoms vary depending on the type of virus and the location of the cells that it invades. Common symptoms of the main types of viral illnesses include: Cold and flu viruses   Fever.  Headache.  Sore throat.  Muscle aches.  Nasal congestion.  Cough. Digestive system (gastrointestinal) viruses   Fever.  Abdominal pain.  Nausea.  Diarrhea. Liver viruses (hepatitis)   Loss of appetite.  Tiredness.  Yellowing of the skin (jaundice). Brain and spinal cord viruses   Fever.  Headache.  Stiff neck.  Nausea and vomiting.  Confusion or sleepiness. Skin viruses   Warts.  Itching.  Rash. Sexually transmitted viruses   Discharge.  Swelling.  Redness.  Rash. How is this treated? Viruses can be difficult to treat because they live within cells. Antibiotic medicines do not treat viruses because these drugs do not get inside cells. Treatment for a viral  illness may include:  Resting and drinking plenty of fluids.  Medicines to relieve symptoms. These can include over-the-counter medicine for pain and fever, medicines for cough or congestion, and medicines to relieve diarrhea.  Antiviral medicines. These drugs are available only for certain types of viruses. They may help reduce flu symptoms if taken early. There are also many antiviral medicines for hepatitis and HIV/AIDS. Some viral illnesses can be prevented with vaccinations. A common example is the flu shot. Follow these instructions at home: Medicines    Take over-the-counter and prescription medicines only as told by your health care provider.  If you were prescribed an antiviral medicine, take it as told by your health care provider. Do not stop taking the medicine even if you start to feel better.  Be aware of when antibiotics are needed and when they are not needed. Antibiotics do not treat viruses. If your health care provider thinks that you may have a bacterial infection as well as a viral infection, you may get an antibiotic.  Do not ask for an antibiotic prescription if you have been diagnosed with a viral illness. That will not make your illness go away faster.  Frequently taking antibiotics when they are not needed can lead to antibiotic resistance. When this develops, the medicine no longer works against the bacteria that it normally fights. General instructions   Drink enough fluids to keep your urine clear or pale yellow.  Rest as much as possible.  Return to your normal activities as told by your health care provider. Ask your health care provider what activities are safe for you.  Keep all follow-up visits as told by your health care provider. This is important. How is this prevented? Take these actions to reduce your risk of viral infection:  Eat a healthy diet and get enough rest.  Wash your hands often with soap and water. This is especially important when  you are in public places. If soap and water are not available, use hand sanitizer.  Avoid close contact with friends and family who have a viral illness.  If you travel to areas where viral gastrointestinal infection is common, avoid drinking water or eating raw food.  Keep your immunizations up to date. Get a flu shot every year as told by your health care provider.  Do not share toothbrushes, nail clippers, razors, or needles with other people.  Always practice safe sex. Contact a health care provider if:  You have symptoms of a viral illness that do not go away.  Your symptoms come back after  going away.  Your symptoms get worse. Get help right away if:  You have trouble breathing.  You have a severe headache or a stiff neck.  You have severe vomiting or abdominal pain. This information is not intended to replace advice given to you by your health care provider. Make sure you discuss any questions you have with your health care provider. Document Released: 03/04/2016 Document Revised: 04/06/2016 Document Reviewed: 03/04/2016 Elsevier Interactive Patient Education  2017 [employer]      Edwina Barth, MD [facility]

## 2017-02-20 ENCOUNTER — Ambulatory Visit (INDEPENDENT_AMBULATORY_CARE_PROVIDER_SITE_OTHER): Payer: BLUE CROSS/BLUE SHIELD | Admitting: Family Medicine

## 2017-02-20 VITALS — BP 112/73 | HR 56 | Temp 98.0°F | Resp 17 | Ht 67.0 in | Wt 129.0 lb

## 2017-02-20 DIAGNOSIS — R110 Nausea: Secondary | ICD-10-CM

## 2017-02-20 DIAGNOSIS — R42 Dizziness and giddiness: Secondary | ICD-10-CM | POA: Diagnosis not present

## 2017-02-20 DIAGNOSIS — R350 Frequency of micturition: Secondary | ICD-10-CM

## 2017-02-20 DIAGNOSIS — R1030 Lower abdominal pain, unspecified: Secondary | ICD-10-CM

## 2017-02-20 DIAGNOSIS — D649 Anemia, unspecified: Secondary | ICD-10-CM | POA: Diagnosis not present

## 2017-02-20 DIAGNOSIS — N390 Urinary tract infection, site not specified: Secondary | ICD-10-CM

## 2017-02-20 LAB — POCT CBC
Granulocyte percent: 52.4 %G (ref 37–80)
HCT, POC: 32.8 % — AB (ref 37.7–47.9)
HEMOGLOBIN: 10.6 g/dL — AB (ref 12.2–16.2)
LYMPH, POC: 3.1 (ref 0.6–3.4)
MCH: 22.5 pg — AB (ref 27–31.2)
MCHC: 32.2 g/dL (ref 31.8–35.4)
MCV: 70.1 fL — AB (ref 80–97)
MID (cbc): 0.3 (ref 0–0.9)
MPV: 9.2 fL (ref 0–99.8)
POC GRANULOCYTE: 3.7 (ref 2–6.9)
POC LYMPH PERCENT: 43 %L (ref 10–50)
POC MID %: 4.6 %M (ref 0–12)
Platelet Count, POC: 266 10*3/uL (ref 142–424)
RBC: 4.68 M/uL (ref 4.04–5.48)
RDW, POC: 13.5 %
WBC: 7.1 10*3/uL (ref 4.6–10.2)

## 2017-02-20 LAB — POC MICROSCOPIC URINALYSIS (UMFC)

## 2017-02-20 LAB — POCT URINALYSIS DIP (MANUAL ENTRY)
BILIRUBIN UA: NEGATIVE
BILIRUBIN UA: NEGATIVE mg/dL
Blood, UA: NEGATIVE
Glucose, UA: NEGATIVE mg/dL
Leukocytes, UA: NEGATIVE
Nitrite, UA: NEGATIVE
PROTEIN UA: NEGATIVE mg/dL
Spec Grav, UA: >=1.03 — AB (ref 1.010–1.025)
Urobilinogen, UA: 0.2 E.U./dL
pH, UA: 6 (ref 5.0–8.0)

## 2017-02-20 LAB — POCT URINE PREGNANCY: Preg Test, Ur: NEGATIVE

## 2017-02-20 MED ORDER — NITROFURANTOIN MONOHYD MACRO 100 MG PO CAPS: 100.0000 mg | ORAL_CAPSULE | Freq: Two times a day (BID) | ORAL | 0 refills | Status: DC

## 2017-02-20 NOTE — Patient Instructions (Addendum)
Your blood count does indicate some component of anemia. Start over-the-counter iron supplement once per day, and recheck that level in the next 4-6 weeks. The urine test indicates a potential urinary tract infection. See information below on that, and start antibiotic for now. I will check a urine culture. The urine was also concentrated, so make sure you're drinking plenty of fluids.   Return to the clinic or go to the nearest emergency room if any of your symptoms worsen or new symptoms occur.   Urinary Tract Infection, Adult A urinary tract infection (UTI) is an infection of any part of the urinary tract, which includes the kidneys, ureters, bladder, and urethra. These organs make, store, and get rid of urine in the body. UTI can be a bladder infection (cystitis) or kidney infection (pyelonephritis). What are the causes? This infection may be caused by fungi, viruses, or bacteria. Bacteria are the most common cause of UTIs. This condition can also be caused by repeated incomplete emptying of the bladder during urination. What increases the risk? This condition is more likely to develop if:  You ignore your need to urinate or hold urine for long periods of time.  You do not empty your bladder completely during urination.  You wipe back to front after urinating or having a bowel movement, if you are female.  You are uncircumcised, if you are female.  You are constipated.  You have a urinary catheter that stays in place (indwelling).  You have a weak defense (immune) system.  You have a medical condition that affects your bowels, kidneys, or bladder.  You have diabetes.  You take antibiotic medicines frequently or for long periods of time, and the antibiotics no longer work well against certain types of infections (antibiotic resistance).  You take medicines that irritate your urinary tract.  You are exposed to chemicals that irritate your urinary tract.  You are female. What  are the signs or symptoms? Symptoms of this condition include:  Fever.  Frequent urination or passing small amounts of urine frequently.  Needing to urinate urgently.  Pain or burning with urination.  Urine that smells bad or unusual.  Cloudy urine.  Pain in the lower abdomen or back.  Trouble urinating.  Blood in the urine.  Vomiting or being less hungry than normal.  Diarrhea or abdominal pain.  Vaginal discharge, if you are female. How is this diagnosed? This condition is diagnosed with a medical history and physical exam. You will also need to provide a urine sample to test your urine. Other tests may be done, including:  Blood tests.  Sexually transmitted disease (STD) testing. If you have had more than one UTI, a cystoscopy or imaging studies may be done to determine the cause of the infections. How is this treated? Treatment for this condition often includes a combination of two or more of the following:  Antibiotic medicine.  Other medicines to treat less common causes of UTI.  Over-the-counter medicines to treat pain.  Drinking enough water to stay hydrated. Follow these instructions at home:  Take over-the-counter and prescription medicines only as told by your health care provider.  If you were prescribed an antibiotic, take it as told by your health care provider. Do not stop taking the antibiotic even if you start to feel better.  Avoid alcohol, caffeine, tea, and carbonated beverages. They can irritate your bladder.  Drink enough fluid to keep your urine clear or pale yellow.  Keep all follow-up visits as told by  your health care provider. This is important.  Make sure to:  Empty your bladder often and completely. Do not hold urine for long periods of time.  Empty your bladder before and after sex.  Wipe from front to back after a bowel movement if you are female. Use each tissue one time when you wipe. Contact a health care provider  if:  You have back pain.  You have a fever.  You feel nauseous or vomit.  Your symptoms do not get better after 3 days.  Your symptoms go away and then return. Get help right away if:  You have severe back pain or lower abdominal pain.  You are vomiting and cannot keep down any medicines or water. This information is not intended to replace advice given to you by your health care provider. Make sure you discuss any questions you have with your health care provider. Document Released: 08/03/2005 Document Revised: 04/06/2016 Document Reviewed: 09/14/2015 Elsevier Interactive Patient Education  2017 [employer].     IF you received an x-ray today, you will receive an invoice from [facility]. Please contact [facility] at [phone] with questions or concerns regarding your invoice.   IF you received labwork today, you will receive an invoice from [facility]. Please contact [facility] at [phone] with questions or concerns regarding your invoice.   Our billing staff will not be able to assist you with questions regarding bills from these companies.  You will be contacted with the lab results as soon as they are available. The fastest way to get your results is to activate your My Chart account. Instructions are located on the last page of this paperwork. If you have not heard from [place] regarding the results in 2 weeks, please contact this office.

## 2017-02-20 NOTE — Progress Notes (Signed)
By signing my name below, I, Mesha Guinyard, attest that this documentation has been prepared under the direction and in the presence of Meredith Staggers, MD.  Electronically Signed: Arvilla Market, Medical Scribe. 02/20/17. 3:31 PM.  Subjective:    Patient ID: Linda Yates, female    DOB: [birth year], 26 y.o.   MRN: [id number]  HPI Chief Complaint  Patient presents with  . Dizziness  . Nausea    HPI Comments: Linda Yates is a 26 y.o. female who presents to the Primary Care at [facility] and [facility] complaining of dizziness and nausea onset 3 days ago. Reports associated sxs of HA, dysuria, trouble urinating, urinary frequency, suprapubic pain (worse in the middle), and fatigue. 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 The next day she slept all day, and yesterday her abdominal pain worsened. Pt was told she had a low chance of anemia. Reports the eggs she was eating was recalled for salmonella. Pt's LNMP was 01/27/17, she is not on [facility], and she hasn't had unprotected sex since discontinuing BC. Denies diarrhea, emesis, fever, and loss of appetite.  Patient Active Problem List   Diagnosis Date Noted  . Viral illness 02/02/2017  . Flu-like symptoms 02/02/2017  . Generalized muscle ache 02/02/2017  . Allergic rhinitis due to pollen 02/10/2015   Past Medical History:  Diagnosis Date  . Allergy    No past surgical history on file. Allergies  Allergen Reactions  . Prednisone    Prior to Admission medications   Not on File   Social History   Social History  . Marital status: Single    Spouse name: N/A  . Number of children: N/A  . Years of education: N/A   Occupational History  . Not on file.   Social History Main Topics  . Smoking status: Never Smoker  . Smokeless tobacco: Never Used  . Alcohol use Not on file  . Drug use: Unknown  . Sexual activity: Not on file    Other Topics Concern  . Not on file   Social History Narrative  . No narrative on file   Review of Systems  Constitutional: Positive for fatigue. Negative for appetite change and fever.  Gastrointestinal: Positive for nausea. Negative for diarrhea and vomiting.  Genitourinary: Positive for difficulty urinating, dysuria and frequency.  Neurological: Positive for dizziness and headaches.   Objective:  Physical Exam  Constitutional: She appears well-developed and well-nourished. No distress.  HENT:  Head: Normocephalic and atraumatic.  Eyes: Conjunctivae are normal.  Neck: Neck supple.  Cardiovascular: Normal rate, regular rhythm and normal heart sounds.  Exam reveals no gallop and no friction rub.   No murmur heard. Pulmonary/Chest: Effort normal and breath sounds normal. No respiratory distress. She has no wheezes. She has no rales.  Abdominal: Bowel sounds are normal. There is tenderness (minimal). There is no CVA tenderness, no tenderness at McBurney's point and negative Murphy's sign.  Neurological: She is alert.  Skin: Skin is warm and dry.  Psychiatric: She has a normal mood and affect. Her behavior is normal.  Nursing note and vitals reviewed.   Vitals:   02/20/17 1449  BP: 112/73  Pulse: (!) 56  Resp: 17  Temp: 98 F (36.7 C)  TempSrc: Oral  SpO2: 97%  Weight: 129 lb (58.5 kg)  Height:  (1.702 m)  Body mass index is 20.2 kg/m.  Results for orders placed or performed in visit on 02/20/17  POCT CBC  Result Value Ref Range   WBC 7.1 4.6 - 10.2 K/uL   Lymph, poc 3.1 0.6 - 3.4   POC LYMPH PERCENT 43.0 10 - 50 %L   MID (cbc) 0.3 0 - 0.9   POC MID % 4.6 0 - 12 %M   POC Granulocyte 3.7 2 - 6.9   Granulocyte percent 52.4 37 - 80 %G   RBC 4.68 4.04 - 5.48 M/uL   Hemoglobin 10.6 (A) 12.2 - 16.2 g/dL   HCT, POC 10.2 (A) 72.5 - 47.9 %   MCV 70.1 (A) 80 - 97 fL   MCH, POC 22.5 (A) 27 - 31.2 pg   MCHC 32.2 31.8 - 35.4 g/dL   RDW, POC 36.6 %   Platelet  Count, POC 266 142 - 424 K/uL   MPV 9.2 0 - 99.8 fL  POCT urine pregnancy  Result Value Ref Range   Preg Test, Ur Negative Negative  POCT urinalysis dipstick  Result Value Ref Range   Color, UA yellow yellow   Clarity, UA clear clear   Glucose, UA negative negative mg/dL   Bilirubin, UA negative negative   Ketones, POC UA negative negative mg/dL   Spec Grav, UA >=4.403 (A) 1.010 - 1.025   Blood, UA negative negative   pH, UA 6.0 5.0 - 8.0   Protein Ur, POC negative negative mg/dL   Urobilinogen, UA 0.2 0.2 or 1.0 E.U./dL   Nitrite, UA Negative Negative   Leukocytes, UA Negative Negative  POCT Microscopic Urinalysis ([facility])  Result Value Ref Range   WBC,UR,HPF,POC Moderate (A) None WBC/hpf   RBC,UR,HPF,POC None None RBC/hpf   Bacteria Few (A) None, Too numerous to count   Mucus Present (A) Absent   Epithelial Cells, UR Per Microscopy Moderate (A) None, Too numerous to count cells/hpf   Assessment & Plan:    Linda Yates is a 26 y.o. female Dizziness - Plan: POCT CBC, POCT urine pregnancy  Urine frequency - Plan: POCT urine pregnancy, POCT urinalysis dipstick, POCT Microscopic Urinalysis ([facility]), nitrofurantoin, macrocrystal-monohydrate, (MACROBID) 100 MG capsule, Urine culture  Nausea without vomiting - Plan: POCT urine pregnancy  Abdominal pain, lower - Plan: POCT CBC, POCT urine pregnancy, POCT urinalysis dipstick, POCT Microscopic Urinalysis ([facility])  Anemia, unspecified type - Plan: Iron  Urinary tract infection without hematuria, site unspecified  Initial concern of foodborne illness, but without diarrhea or true vomiting, less likely. Urinalysis concerning for possible UTI. Start Macrobid, check urine culture, symptomatic care and RTC precautions. Increase fluid intake.  For anemia, check iron levels, but start over-the-counter iron supplement once per day. Recheck levels in 4-6 weeks.  Meds ordered this encounter  Medications  . nitrofurantoin,  macrocrystal-monohydrate, (MACROBID) 100 MG capsule    Sig: Take 1 capsule (100 mg total) by mouth 2 (two) times daily.    Dispense:  14 capsule    Refill:  0   Patient Instructions    Your blood count does indicate some component of anemia. Start over-the-counter iron supplement once per day, and recheck that level in the next 4-6 weeks. The urine test indicates a potential urinary tract infection. See information below on that, and start antibiotic for now. I will check a urine culture. The urine was also concentrated, so make sure you're drinking plenty of fluids.   Return to the clinic or go to the nearest emergency room if any of your symptoms worsen or new symptoms occur.  Urinary Tract Infection, Adult A urinary tract infection (UTI) is an infection of any part of the urinary tract, which includes the kidneys, ureters, bladder, and urethra. These organs make, store, and get rid of urine in the body. UTI can be a bladder infection (cystitis) or kidney infection (pyelonephritis). What are the causes? This infection may be caused by fungi, viruses, or bacteria. Bacteria are the most common cause of UTIs. This condition can also be caused by repeated incomplete emptying of the bladder during urination. What increases the risk? This condition is more likely to develop if:  You ignore your need to urinate or hold urine for long periods of time.  You do not empty your bladder completely during urination.  You wipe back to front after urinating or having a bowel movement, if you are female.  You are uncircumcised, if you are female.  You are constipated.  You have a urinary catheter that stays in place (indwelling).  You have a weak defense (immune) system.  You have a medical condition that affects your bowels, kidneys, or bladder.  You have diabetes.  You take antibiotic medicines frequently or for long periods of time, and the antibiotics no longer work well against certain  types of infections (antibiotic resistance).  You take medicines that irritate your urinary tract.  You are exposed to chemicals that irritate your urinary tract.  You are female. What are the signs or symptoms? Symptoms of this condition include:  Fever.  Frequent urination or passing small amounts of urine frequently.  Needing to urinate urgently.  Pain or burning with urination.  Urine that smells bad or unusual.  Cloudy urine.  Pain in the lower abdomen or back.  Trouble urinating.  Blood in the urine.  Vomiting or being less hungry than normal.  Diarrhea or abdominal pain.  Vaginal discharge, if you are female. How is this diagnosed? This condition is diagnosed with a medical history and physical exam. You will also need to provide a urine sample to test your urine. Other tests may be done, including:  Blood tests.  Sexually transmitted disease (STD) testing. If you have had more than one UTI, a cystoscopy or imaging studies may be done to determine the cause of the infections. How is this treated? Treatment for this condition often includes a combination of two or more of the following:  Antibiotic medicine.  Other medicines to treat less common causes of UTI.  Over-the-counter medicines to treat pain.  Drinking enough water to stay hydrated. Follow these instructions at home:  Take over-the-counter and prescription medicines only as told by your health care provider.  If you were prescribed an antibiotic, take it as told by your health care provider. Do not stop taking the antibiotic even if you start to feel better.  Avoid alcohol, caffeine, tea, and carbonated beverages. They can irritate your bladder.  Drink enough fluid to keep your urine clear or pale yellow.  Keep all follow-up visits as told by your health care provider. This is important.  Make sure to:  Empty your bladder often and completely. Do not hold urine for long periods of  time.  Empty your bladder before and after sex.  Wipe from front to back after a bowel movement if you are female. Use each tissue one time when you wipe. Contact a health care provider if:  You have back pain.  You have a fever.  You feel nauseous or vomit.  Your symptoms do not get better after  3 days.  Your symptoms go away and then return. Get help right away if:  You have severe back pain or lower abdominal pain.  You are vomiting and cannot keep down any medicines or water. This information is not intended to replace advice given to you by your health care provider. Make sure you discuss any questions you have with your health care provider. Document Released: 08/03/2005 Document Revised: 04/06/2016 Document Reviewed: 09/14/2015 Elsevier Interactive Patient Education  2017 [employer].     IF you received an x-ray today, you will receive an invoice from [facility]. Please contact [facility] at [phone] with questions or concerns regarding your invoice.   IF you received labwork today, you will receive an invoice from [facility]. Please contact [facility] at [phone] with questions or concerns regarding your invoice.   Our billing staff will not be able to assist you with questions regarding bills from these companies.  You will be contacted with the lab results as soon as they are available. The fastest way to get your results is to activate your My Chart account. Instructions are located on the last page of this paperwork. If you have not heard from [place] regarding the results in 2 weeks, please contact this office.       I personally performed the services described in this documentation, which was scribed in my presence. The recorded information has been reviewed and considered for accuracy and completeness, addended by me as needed, and agree with information above.  Signed,   Meredith Staggers, MD Primary Care at [facility].  02/22/17 12:27 PM

## 2017-02-21 LAB — URINE CULTURE

## 2017-02-21 LAB — IRON: Iron: 55 ug/dL (ref 27–159)

## 2017-02-23 ENCOUNTER — Telehealth: Payer: Self-pay | Admitting: Family Medicine

## 2017-02-23 NOTE — Telephone Encounter (Signed)
Pt called to get lab results I gave her message dr Neva Seat left pt understand

## 2018-02-05 ENCOUNTER — Other Ambulatory Visit: Payer: Self-pay | Admitting: Podiatry

## 2018-02-05 ENCOUNTER — Encounter: Payer: Self-pay | Admitting: Podiatry

## 2018-02-05 ENCOUNTER — Ambulatory Visit: Payer: BLUE CROSS/BLUE SHIELD | Admitting: Podiatry

## 2018-02-05 ENCOUNTER — Ambulatory Visit (INDEPENDENT_AMBULATORY_CARE_PROVIDER_SITE_OTHER): Payer: BLUE CROSS/BLUE SHIELD

## 2018-02-05 VITALS — BP 103/62 | HR 85 | Resp 16

## 2018-02-05 DIAGNOSIS — M79674 Pain in right toe(s): Secondary | ICD-10-CM | POA: Diagnosis not present

## 2018-02-05 DIAGNOSIS — L03032 Cellulitis of left toe: Secondary | ICD-10-CM

## 2018-02-05 DIAGNOSIS — L03031 Cellulitis of right toe: Secondary | ICD-10-CM

## 2018-02-05 NOTE — Patient Instructions (Signed)

## 2018-02-06 NOTE — Progress Notes (Signed)
Subjective:   Patient ID: Linda Yates, female   DOB: 27 y.o.   MRN: [id number]   HPI Patient presents stating she traumatized her big toenail and she lost it and had to have it removed around a year ago and was doing well but recently it is starting to get swollen and painful and she wanted to have it checked   ROS      Objective:  Physical Exam  Neurovascular status intact muscle strength adequate with patient noted to have a very irritated right hallux nail with fluid buildup on the proximal portion with no proximal edema erythema or drainage noted.  It is very painful when palpated     Assessment:  Probability for a localized paronychia infection with damaged hallux nail left that has been traumatized in the past thick and somehow became irritated     Plan:  H&P as precautionary x-ray was taken and today I did advised on removal of the nail flushing the area out going ahead and getting a culture and going ahead and starting soaks.  I infiltrated the left hallux 60 mg like Marcaine mixture I removed the nail removed all fluid flush the area after culturing first and since there is no proximal edema erythema there is no odor there is no indications of advanced infection we will just have her soak it and it should be fine but I gave strict instructions of any redness were to occur swelling or systemic signs of infection that she needs to contact us immediately will start antibiotics.  This should heal uneventfully and ultimately she knows she may lose this nail permanently  X-ray indicates that there is no signs of ostial lysis or bony pathology

## 2018-02-07 ENCOUNTER — Telehealth: Payer: Self-pay | Admitting: Podiatry

## 2018-02-07 MED ORDER — TRAMADOL HCL 50 MG PO TABS: ORAL_TABLET | ORAL | 0 refills | Status: DC

## 2018-02-07 NOTE — Addendum Note (Signed)
Addended by: Alphia Kava'CONNELL, VALERY D on: 02/07/2018 12:09 PM   Modules accepted: Orders

## 2018-02-07 NOTE — Telephone Encounter (Signed)
I was in the office on Monday and had a toenail procedure done. I was calling to see if I can get a pain medication prescribed as I'm in excruciating pain and have not been able to sleep all night. I know that they told me to take ibuprofen on Monday and to also do epsom salt soak. My phone number is [phone]. Thank you.

## 2018-02-07 NOTE — Telephone Encounter (Addendum)
Dr. Charlsie Merlesegal ordered Tramadol 50mg  #10 one tablet every 8 hours prn foot pain. I informed pt and she states understanding. Left message [phone] with Tramadol orders.

## 2018-02-09 LAB — WOUND CULTURE
MICRO NUMBER:: 90400466
SPECIMEN QUALITY:: ADEQUATE

## 2018-06-18 ENCOUNTER — Encounter: Payer: Self-pay | Admitting: Podiatry

## 2018-06-18 ENCOUNTER — Ambulatory Visit (INDEPENDENT_AMBULATORY_CARE_PROVIDER_SITE_OTHER): Payer: BLUE CROSS/BLUE SHIELD | Admitting: Podiatry

## 2018-06-18 DIAGNOSIS — L03032 Cellulitis of left toe: Secondary | ICD-10-CM | POA: Diagnosis not present

## 2018-06-18 DIAGNOSIS — M79674 Pain in right toe(s): Secondary | ICD-10-CM

## 2018-10-01 ENCOUNTER — Telehealth: Payer: Self-pay

## 2018-10-01 NOTE — Telephone Encounter (Signed)
Left the pt a message that I was giving her a call to schedule her an appt.  The nurse with her job called about the pt having some pelvic pain and that the pt did urinalysis for possible UTI but it was negative.

## 2018-10-03 ENCOUNTER — Encounter: Payer: Self-pay | Admitting: Internal Medicine

## 2018-10-03 ENCOUNTER — Ambulatory Visit: Payer: BLUE CROSS/BLUE SHIELD | Admitting: Internal Medicine

## 2018-10-03 VITALS — BP 114/62 | HR 70 | Temp 97.8°F | Ht 67.0 in | Wt 131.6 lb

## 2018-10-03 DIAGNOSIS — R142 Eructation: Secondary | ICD-10-CM | POA: Diagnosis not present

## 2018-10-03 DIAGNOSIS — R350 Frequency of micturition: Secondary | ICD-10-CM

## 2018-10-03 LAB — POCT URINALYSIS DIPSTICK
Bilirubin, UA: NEGATIVE
GLUCOSE UA: NEGATIVE
Ketones, UA: NEGATIVE
Leukocytes, UA: NEGATIVE
NITRITE UA: NEGATIVE
PROTEIN UA: NEGATIVE
Spec Grav, UA: 1.02 (ref 1.010–1.025)
Urobilinogen, UA: 0.2 E.U./dL
pH, UA: 7 (ref 5.0–8.0)

## 2018-10-05 ENCOUNTER — Encounter: Payer: Self-pay | Admitting: Internal Medicine

## 2018-10-07 DIAGNOSIS — R350 Frequency of micturition: Secondary | ICD-10-CM | POA: Insufficient documentation

## 2018-10-07 DIAGNOSIS — R142 Eructation: Secondary | ICD-10-CM | POA: Insufficient documentation

## 2018-10-07 NOTE — Progress Notes (Signed)
  Subjective:     Patient ID: Linda Yates , female    DOB: 12/25/1990 , 27 y.o.   MRN: [id number]   Chief Complaint  Patient presents with  . Other    abnormal urine    HPI  She is here today for further evaluation of abnormal urine. 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 She was advised that she had a lot of protein in her urine. She denies having issues with her kidneys/bladder in the past. The abdominal pain is intermittent, dull and throbbing. Located in her mid-abdomen. No associated nausea, vomiting or diarrhea. She reports having normal bowel movements.     Past Medical History:  Diagnosis Date  . Allergy      Family History  Problem Relation Age of Onset  . Hyperthyroidism Mother   . Vision loss Father     No current outpatient medications on file.   Allergies  Allergen Reactions  . Prednisone     Pt stated, "I get insomnia with this medication"     Review of Systems  Constitutional: Negative.   Respiratory: Negative.   Cardiovascular: Negative.   Gastrointestinal: Positive for abdominal pain.  Genitourinary: Positive for frequency.  Neurological: Negative.   Psychiatric/Behavioral: Negative.      Today's Vitals   10/03/18 1211  BP: 114/62  Pulse: 70  Temp: 97.8 F (36.6 C)  TempSrc: Oral  Weight: 131 lb 9.6 oz (59.7 kg)  Height: 5\' 7"  (1.702 m)   Body mass index is 20.61 kg/m.   Objective:  Physical Exam  Constitutional: She appears well-developed and well-nourished.  HENT:  Head: Normocephalic and atraumatic.  Cardiovascular: Normal rate, regular rhythm and normal heart sounds.  Pulmonary/Chest: Effort normal and breath sounds normal.  Abdominal: Soft. Bowel sounds are normal.  Skin: Skin is warm and dry.  Psychiatric: She has a normal mood and affect.  Nursing note and vitals reviewed.       Assessment And Plan:     1. Frequency of urination  Urinalysis  performed. There is some blood, she admits to being on her cycle recently.   - POCT Urinalysis Dipstick (81002)  2. Belching  I think this symptom, in the setting of intermittent abdominal pain, can be attributed to reflux. She was given samples of Nexium OTC to take once daily for next 14 days. She is advised to call me next week to let me know how she is doing. She is encouraged to sign up for Mychart and notify me over the holiday weekend if her sx worsen. She verbalizes understanding of her treatment plan.       Gwynneth Alimentobyn N Maddalynn Barnard, MD

## 2019-08-29 ENCOUNTER — Other Ambulatory Visit: Payer: Self-pay | Admitting: Cardiology

## 2019-08-29 DIAGNOSIS — Z20822 Contact with and (suspected) exposure to covid-19: Secondary | ICD-10-CM

## 2019-08-31 LAB — NOVEL CORONAVIRUS, NAA: SARS-CoV-2, NAA: NOT DETECTED

## 2019-11-28 ENCOUNTER — Ambulatory Visit: Payer: 59 | Admitting: Internal Medicine

## 2019-11-28 ENCOUNTER — Other Ambulatory Visit: Payer: Self-pay

## 2019-11-28 ENCOUNTER — Encounter: Payer: Self-pay | Admitting: Internal Medicine

## 2019-11-28 VITALS — BP 110/82 | HR 76 | Temp 98.1°F | Ht 67.0 in | Wt 124.2 lb

## 2019-11-28 DIAGNOSIS — L02419 Cutaneous abscess of limb, unspecified: Secondary | ICD-10-CM

## 2019-11-28 DIAGNOSIS — Z832 Family history of diseases of the blood and blood-forming organs and certain disorders involving the immune mechanism: Secondary | ICD-10-CM | POA: Diagnosis not present

## 2019-11-28 MED ORDER — CEPHALEXIN 500 MG PO CAPS: 500.0000 mg | ORAL_CAPSULE | Freq: Four times a day (QID) | ORAL | 0 refills | Status: AC

## 2019-11-30 LAB — ANTIPHOSPHOLIPID SYNDROME EVAL, BLD
APTT PPP: 26.9 s (ref 22.9–30.2)
Anticardiolipin IgG: <9 GPL U/mL (ref 0–14)
Anticardiolipin IgM: 9 MPL U/mL (ref 0–12)
Beta-2 Glyco 1 IgM: <9 GPI IgM units (ref 0–32)
Beta-2 Glyco I IgG: <9 GPI IgG units (ref 0–20)
Dilute Viper Venom Time: 29.2 s (ref 0.0–47.0)
Hexagonal Phase Phospholipid: 2 s (ref 0–11)
INR: 1.1 (ref 0.9–1.2)
PT: 11.4 s (ref 9.1–12.0)
Thrombin Time: 15.7 s (ref 0.0–23.0)

## 2019-11-30 LAB — COAG STUDIES INTERP REPORT

## 2019-12-03 NOTE — Progress Notes (Signed)
This visit occurred during the SARS-CoV-2 public health emergency.  Safety protocols were in place, including screening questions prior to the visit, additional usage of staff PPE, and extensive cleaning of exam room while observing appropriate contact time as indicated for disinfecting solutions.  Subjective:     Patient ID: Linda Yates , female    DOB: Oct 18, 1991 , 29 y.o.   MRN: [id number]   Chief Complaint  Patient presents with  . Recurrent Skin Infections    both arm pits  . discuss family health history    HPI  She is here today for further evaluation of infections in her arm pits. She first noticed this about a week ago, now the area is more painful. She has not noticed any drainage from the lesion. She does shave her underarms. She has had similar sx in the past.    She reports she wants to update her family history. She states her maternal aunt was recently diagnosed with antiphospholipid syndrome. She is not sure if anyone else in her family has this. Her Mom wanted her to update her family history. She is not sure if her Mom or Aunt have h/o miscarriages. She is not sure of any family history of blood clots.     Past Medical History:  Diagnosis Date  . Allergy      Family History  Problem Relation Age of Onset  . Hyperthyroidism Mother   . Vision loss Father   . Autoimmune disease Maternal Grandmother   . Hypertension Maternal Grandmother   . Diabetes Maternal Grandmother   . Congestive Heart Failure Maternal Grandmother   . Kidney disease Maternal Grandmother   . Vitamin D deficiency Maternal Grandmother   . Stroke Maternal Grandmother   . Hyperlipidemia Maternal Grandmother   . Hyperthyroidism Maternal Grandmother      Current Outpatient Medications:  .  cephALEXin (KEFLEX) 500 MG capsule, Take 1 capsule (500 mg total) by mouth 4 (four) times daily for 10 days., Disp: 40 capsule, Rfl: 0   Allergies  Allergen Reactions  . Prednisone     Pt stated, "I  get insomnia with this medication"     Review of Systems  Constitutional: Negative.   Respiratory: Negative.   Cardiovascular: Negative.   Gastrointestinal: Negative.   Neurological: Negative.   Psychiatric/Behavioral: Negative.      Today's Vitals   11/28/19 1605  BP: 110/82  Pulse: 76  Temp: 98.1 F (36.7 C)  TempSrc: Oral  Weight: 124 lb 3.2 oz (56.3 kg)  Height: 5\' 7"  (1.702 m)   Body mass index is 19.45 kg/m.   Objective:  Physical Exam Vitals and nursing note reviewed.  Constitutional:      Appearance: Normal appearance.  HENT:     Head: Normocephalic and atraumatic.  Cardiovascular:     Rate and Rhythm: Normal rate and regular rhythm.     Heart sounds: Normal heart sounds.  Pulmonary:     Effort: Pulmonary effort is normal.     Breath sounds: Normal breath sounds.  Skin:    General: Skin is warm.     Comments: Subcutaneous tender mass, about 1cm, without overlying erythema. Area is warm. No drainage noted.   Neurological:     General: No focal deficit present.     Mental Status: She is alert.  Psychiatric:        Mood and Affect: Mood normal.        Behavior: Behavior normal.  Assessment And Plan:     1. Axillary abscess  She was given rx cephalexin and encouraged to take full abx course. She is advised that area may become more painful during this course of therapy. She is encouraged to update me w/ her sx in a day or two.   2. Family history of antiphospholipid syndrome  At her request, I will check appropriate bloodwork. I will make further recommendations once her labs are available for review.   - Antiphospholipid syndrome eval, bld        Gwynneth Aliment, MD    THE PATIENT IS ENCOURAGED TO PRACTICE SOCIAL DISTANCING DUE TO THE COVID-19 PANDEMIC.

## 2019-12-03 NOTE — Patient Instructions (Signed)

## 2019-12-04 ENCOUNTER — Encounter: Payer: Self-pay | Admitting: Internal Medicine

## 2020-02-12 ENCOUNTER — Encounter: Payer: Self-pay | Admitting: Internal Medicine

## 2020-02-12 ENCOUNTER — Other Ambulatory Visit: Payer: Self-pay

## 2020-02-12 ENCOUNTER — Ambulatory Visit (INDEPENDENT_AMBULATORY_CARE_PROVIDER_SITE_OTHER): Payer: 59 | Admitting: Internal Medicine

## 2020-02-12 VITALS — BP 120/82 | HR 76 | Temp 97.9°F | Ht 67.0 in | Wt 127.0 lb

## 2020-02-12 DIAGNOSIS — Z0000 Encounter for general adult medical examination without abnormal findings: Secondary | ICD-10-CM

## 2020-02-12 DIAGNOSIS — N920 Excessive and frequent menstruation with regular cycle: Secondary | ICD-10-CM

## 2020-02-12 NOTE — Progress Notes (Signed)
This visit occurred during the SARS-CoV-2 public health emergency.  Safety protocols were in place, including screening questions prior to the visit, additional usage of staff PPE, and extensive cleaning of exam room while observing appropriate contact time as indicated for disinfecting solutions.  Subjective:     Patient ID: Linda Yates , female    DOB: 1990-11-23 , 29 y.o.   MRN: [id number]   Chief Complaint  Patient presents with  . Annual Exam    HPI  She is here today for a full physical examination. She is followed by Dr. Mancel Bale for her GYN exams. She admits that she has not been seen in greater than 2 years. She is not currently sexually active.     Past Medical History:  Diagnosis Date  . Allergy      Family History  Problem Relation Age of Onset  . Hyperthyroidism Mother   . Vision loss Father   . Autoimmune disease Maternal Grandmother   . Hypertension Maternal Grandmother   . Diabetes Maternal Grandmother   . Congestive Heart Failure Maternal Grandmother   . Kidney disease Maternal Grandmother   . Vitamin D deficiency Maternal Grandmother   . Stroke Maternal Grandmother   . Hyperlipidemia Maternal Grandmother   . Hyperthyroidism Maternal Grandmother     No current outpatient medications on file.   Allergies  Allergen Reactions  . Prednisone     Pt stated, "I get insomnia with this medication"     Review of Systems  Constitutional: Negative.   HENT: Negative.   Eyes: Negative.   Respiratory: Negative.   Cardiovascular: Negative.   Endocrine: Negative.   Genitourinary: Negative.        She c/o heavy cycles. She reports having accidents on occasion. She usually uses maxipads. Has not tried tampons yet.   Musculoskeletal: Negative.   Skin: Negative.   Allergic/Immunologic: Negative.   Neurological: Negative.   Hematological: Negative.   Psychiatric/Behavioral: Negative.      Today's Vitals   02/12/20 1510  BP: 120/82  Pulse: 76  Temp: 97.9  F (36.6 C)  Weight: 127 lb (57.6 kg)  Height: 5' 7"  (1.702 m)   Body mass index is 19.89 kg/m.   Objective:  Physical Exam Vitals and nursing note reviewed.  Constitutional:      Appearance: Normal appearance.  HENT:     Head: Normocephalic and atraumatic.     Right Ear: Tympanic membrane, ear canal and external ear normal.     Left Ear: Tympanic membrane, ear canal and external ear normal.     Nose:     Comments: Deferred, masked    Mouth/Throat:     Comments: Deferred, masked Eyes:     Extraocular Movements: Extraocular movements intact.     Conjunctiva/sclera: Conjunctivae normal.     Pupils: Pupils are equal, round, and reactive to light.  Cardiovascular:     Rate and Rhythm: Normal rate and regular rhythm.     Pulses: Normal pulses.     Heart sounds: Normal heart sounds.  Pulmonary:     Effort: Pulmonary effort is normal.     Breath sounds: Normal breath sounds.  Chest:     Breasts: Tanner Score is 5.        Right: Normal.        Left: Normal.  Abdominal:     General: Abdomen is flat. Bowel sounds are normal.     Palpations: Abdomen is soft.  Genitourinary:    Comments: deferred Musculoskeletal:  General: Normal range of motion.     Cervical back: Normal range of motion and neck supple.  Skin:    General: Skin is warm and dry.  Neurological:     General: No focal deficit present.     Mental Status: She is alert and oriented to person, place, and time.  Psychiatric:        Mood and Affect: Mood normal.        Behavior: Behavior normal.         Assessment And Plan:     1. Routine general medical examination at health care facility  A full exam was performed, with exception of pelvic exam. Importance of monthly self breast exams was discussed with the patient. PATIENT IS ADVISED TO GET 30-45 MINUTES REGULAR EXERCISE NO LESS THAN FOUR TO FIVE DAYS PER WEEK - BOTH WEIGHTBEARING EXERCISES AND AEROBIC ARE RECOMMENDED.  SHE IS ADVISED TO FOLLOW A  HEALTHY DIET WITH AT LEAST SIX FRUITS/VEGGIES PER DAY, DECREASE INTAKE OF RED MEAT, AND TO INCREASE FISH INTAKE TO TWO DAYS PER WEEK.  MEATS/FISH SHOULD NOT BE FRIED, BAKED OR BROILED IS PREFERABLE.  I SUGGEST WEARING SPF 50 SUNSCREEN ON EXPOSED PARTS AND ESPECIALLY WHEN IN THE DIRECT SUNLIGHT FOR AN EXTENDED PERIOD OF TIME.  PLEASE AVOID FAST FOOD RESTAURANTS AND INCREASE YOUR WATER INTAKE.  - CMP14+EGFR - CBC - Lipid panel - TSH  2. Menorrhagia with regular cycle  I will refer her to GYN for further evaluation. Pt advised to consider wearing tampon, along with maxipad to decrease risk of accidents. I will also check her thyroid function today.   - Ambulatory referral to Gynecology   Maximino Greenland, MD    THE PATIENT IS ENCOURAGED TO PRACTICE SOCIAL DISTANCING DUE TO THE COVID-19 PANDEMIC.

## 2020-02-12 NOTE — Patient Instructions (Signed)
Health Maintenance, Female Adopting a healthy lifestyle and getting preventive care are important in promoting health and wellness. Ask your health care provider about:  The right schedule for you to have regular tests and exams.  Things you can do on your own to prevent diseases and keep yourself healthy. What should I know about diet, weight, and exercise? Eat a healthy diet   Eat a diet that includes plenty of vegetables, fruits, low-fat dairy products, and lean protein.  Do not eat a lot of foods that are high in solid fats, added sugars, or sodium. Maintain a healthy weight Body mass index (BMI) is used to identify weight problems. It estimates body fat based on height and weight. Your health care provider can help determine your BMI and help you achieve or maintain a healthy weight. Get regular exercise Get regular exercise. This is one of the most important things you can do for your health. Most adults should:  Exercise for at least 150 minutes each week. The exercise should increase your heart rate and make you sweat (moderate-intensity exercise).  Do strengthening exercises at least twice a week. This is in addition to the moderate-intensity exercise.  Spend less time sitting. Even light physical activity can be beneficial. Watch cholesterol and blood lipids Have your blood tested for lipids and cholesterol at 27 years of age, then have this test every 5 years. Have your cholesterol levels checked more often if:  Your lipid or cholesterol levels are high.  You are older than 27 years of age.  You are at high risk for heart disease. What should I know about cancer screening? Depending on your health history and family history, you may need to have cancer screening at various ages. This may include screening for:  Breast cancer.  Cervical cancer.  Colorectal cancer.  Skin cancer.  Lung cancer. What should I know about heart disease, diabetes, and high blood  pressure? Blood pressure and heart disease  High blood pressure causes heart disease and increases the risk of stroke. This is more likely to develop in people who have high blood pressure readings, are of African descent, or are overweight.  Have your blood pressure checked: ? Every 3-5 years if you are 18-39 years of age. ? Every year if you are 40 years old or older. Diabetes Have regular diabetes screenings. This checks your fasting blood sugar level. Have the screening done:  Once every three years after age 40 if you are at a normal weight and have a low risk for diabetes.  More often and at a younger age if you are overweight or have a high risk for diabetes. What should I know about preventing infection? Hepatitis B If you have a higher risk for hepatitis B, you should be screened for this virus. Talk with your health care provider to find out if you are at risk for hepatitis B infection. Hepatitis C Testing is recommended for:  Everyone born from 1945 through 1965.  Anyone with known risk factors for hepatitis C. Sexually transmitted infections (STIs)  Get screened for STIs, including gonorrhea and chlamydia, if: ? You are sexually active and are younger than 27 years of age. ? You are older than 27 years of age and your health care provider tells you that you are at risk for this type of infection. ? Your sexual activity has changed since you were last screened, and you are at increased risk for chlamydia or gonorrhea. Ask your health care provider if   you are at risk.  Ask your health care provider about whether you are at high risk for HIV. Your health care provider may recommend a prescription medicine to help prevent HIV infection. If you choose to take medicine to prevent HIV, you should first get tested for HIV. You should then be tested every 3 months for as long as you are taking the medicine. Pregnancy  If you are about to stop having your period (premenopausal) and  you may become pregnant, seek counseling before you get pregnant.  Take 400 to 800 micrograms (mcg) of folic acid every day if you become pregnant.  Ask for birth control (contraception) if you want to prevent pregnancy. Osteoporosis and menopause Osteoporosis is a disease in which the bones lose minerals and strength with aging. This can result in bone fractures. If you are 65 years old or older, or if you are at risk for osteoporosis and fractures, ask your health care provider if you should:  Be screened for bone loss.  Take a calcium or vitamin D supplement to lower your risk of fractures.  Be given hormone replacement therapy (HRT) to treat symptoms of menopause. Follow these instructions at home: Lifestyle  Do not use any products that contain nicotine or tobacco, such as cigarettes, e-cigarettes, and chewing tobacco. If you need help quitting, ask your health care provider.  Do not use street drugs.  Do not share needles.  Ask your health care provider for help if you need support or information about quitting drugs. Alcohol use  Do not drink alcohol if: ? Your health care provider tells you not to drink. ? You are pregnant, may be pregnant, or are planning to become pregnant.  If you drink alcohol: ? Limit how much you use to 0-1 drink a day. ? Limit intake if you are breastfeeding.  Be aware of how much alcohol is in your drink. In the [place] one drink equals one 12 oz bottle of beer (355 mL), one 5 oz glass of wine (148 mL), or one 1 oz glass of hard liquor (44 mL). General instructions  Schedule regular health, dental, and eye exams.  Stay current with your vaccines.  Tell your health care provider if: ? You often feel depressed. ? You have ever been abused or do not feel safe at home. Summary  Adopting a healthy lifestyle and getting preventive care are important in promoting health and wellness.  Follow your health care provider's instructions about healthy  diet, exercising, and getting tested or screened for diseases.  Follow your health care provider's instructions on monitoring your cholesterol and blood pressure. This information is not intended to replace advice given to you by your health care provider. Make sure you discuss any questions you have with your health care provider. Document Revised: 10/17/2018 Document Reviewed: 10/17/2018 Elsevier Patient Education  2020 Elsevier Inc.  

## 2020-02-13 LAB — CMP14+EGFR
ALT: 12 IU/L (ref 0–32)
AST: 16 IU/L (ref 0–40)
Albumin/Globulin Ratio: 1.5 (ref 1.2–2.2)
Albumin: 4.6 g/dL (ref 3.9–5.0)
Alkaline Phosphatase: 41 IU/L (ref 39–117)
BUN/Creatinine Ratio: 22 (ref 9–23)
BUN: 15 mg/dL (ref 6–20)
Bilirubin Total: 0.3 mg/dL (ref 0.0–1.2)
CO2: 21 mmol/L (ref 20–29)
Calcium: 9.2 mg/dL (ref 8.7–10.2)
Chloride: 105 mmol/L (ref 96–106)
Creatinine, Ser: 0.67 mg/dL (ref 0.57–1.00)
GFR calc Af Amer: 140 mL/min/{1.73_m2} (ref >59)
GFR calc non Af Amer: 122 mL/min/{1.73_m2} (ref >59)
Globulin, Total: 3 g/dL (ref 1.5–4.5)
Glucose: 75 mg/dL (ref 65–99)
Potassium: 4.2 mmol/L (ref 3.5–5.2)
Sodium: 139 mmol/L (ref 134–144)
Total Protein: 7.6 g/dL (ref 6.0–8.5)

## 2020-02-13 LAB — LIPID PANEL
Chol/HDL Ratio: 2 ratio (ref 0.0–4.4)
Cholesterol, Total: 149 mg/dL (ref 100–199)
HDL: 76 mg/dL (ref >39)
LDL Chol Calc (NIH): 66 mg/dL (ref 0–99)
Triglycerides: 23 mg/dL (ref 0–149)
VLDL Cholesterol Cal: 7 mg/dL (ref 5–40)

## 2020-02-13 LAB — CBC
Hematocrit: 34.7 % (ref 34.0–46.6)
Hemoglobin: 10 g/dL — ABNORMAL LOW (ref 11.1–15.9)
MCH: 20.8 pg — ABNORMAL LOW (ref 26.6–33.0)
MCHC: 28.8 g/dL — ABNORMAL LOW (ref 31.5–35.7)
MCV: 72 fL — ABNORMAL LOW (ref 79–97)
Platelets: 361 10*3/uL (ref 150–450)
RBC: 4.8 x10E6/uL (ref 3.77–5.28)
RDW: 13.4 % (ref 11.7–15.4)
WBC: 7.3 10*3/uL (ref 3.4–10.8)

## 2020-02-13 LAB — TSH: TSH: 0.551 u[IU]/mL (ref 0.450–4.500)

## 2020-02-18 LAB — FERRITIN: Ferritin: 6 ng/mL — ABNORMAL LOW (ref 15–150)

## 2020-02-18 LAB — IRON AND TIBC
Iron Saturation: 10 % — ABNORMAL LOW (ref 15–55)
Iron: 43 ug/dL (ref 27–159)
Total Iron Binding Capacity: 420 ug/dL (ref 250–450)
UIBC: 377 ug/dL (ref 131–425)

## 2020-02-18 LAB — SPECIMEN STATUS REPORT

## 2020-04-20 ENCOUNTER — Ambulatory Visit (INDEPENDENT_AMBULATORY_CARE_PROVIDER_SITE_OTHER): Payer: No Typology Code available for payment source

## 2020-04-20 ENCOUNTER — Other Ambulatory Visit: Payer: Self-pay

## 2020-04-20 VITALS — BP 100/60 | HR 78 | Temp 98.2°F | Ht 65.6 in | Wt 126.6 lb

## 2020-04-20 DIAGNOSIS — Z23 Encounter for immunization: Secondary | ICD-10-CM

## 2020-04-20 NOTE — Progress Notes (Signed)
Pt is here today for a tdap. Side effects explained to pt.

## 2020-04-24 ENCOUNTER — Other Ambulatory Visit: Payer: Self-pay

## 2020-04-27 ENCOUNTER — Other Ambulatory Visit (HOSPITAL_COMMUNITY)
Admission: RE | Admit: 2020-04-27 | Discharge: 2020-04-27 | Disposition: A | Discharge status: Home / Self Care | Payer: No Typology Code available for payment source | Source: Ambulatory Visit | Attending: Obstetrics & Gynecology | Admitting: Obstetrics & Gynecology

## 2020-04-27 ENCOUNTER — Encounter: Payer: Self-pay | Admitting: Obstetrics & Gynecology

## 2020-04-27 ENCOUNTER — Other Ambulatory Visit: Payer: Self-pay

## 2020-04-27 ENCOUNTER — Ambulatory Visit: Payer: No Typology Code available for payment source | Admitting: Obstetrics & Gynecology

## 2020-04-27 VITALS — BP 110/70 | HR 68 | Temp 97.2°F | Resp 16 | Ht 66.25 in | Wt 125.0 lb

## 2020-04-27 DIAGNOSIS — D500 Iron deficiency anemia secondary to blood loss (chronic): Secondary | ICD-10-CM | POA: Diagnosis not present

## 2020-04-27 DIAGNOSIS — Z124 Encounter for screening for malignant neoplasm of cervix: Secondary | ICD-10-CM

## 2020-04-27 DIAGNOSIS — Z01419 Encounter for gynecological examination (general) (routine) without abnormal findings: Secondary | ICD-10-CM | POA: Diagnosis not present

## 2020-04-27 DIAGNOSIS — R011 Cardiac murmur, unspecified: Secondary | ICD-10-CM | POA: Diagnosis not present

## 2020-04-27 MED ORDER — TRANEXAMIC ACID 650 MG PO TABS: 1300.0000 mg | ORAL_TABLET | Freq: Three times a day (TID) | ORAL | 2 refills | Status: DC

## 2020-04-27 NOTE — Progress Notes (Signed)
29 y.o. [name] here for annual exam.  Moved to [facility] for college and stayed.  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    Cycles are very heavy with clots.  She has a lot of cramps with her cycles.  Typically she has flow for 7 days.  She will have heavy bleeding for the first 4-5 days.  She only uses maxi pads and wears two at a time.  Has to change every 4 hours.  Has to be careful about not leaking through.    Had an ultrasound done with Dr. Mancel Yates at [facility].  Just had blood work done and ferritin level was 6.  Hb was 10.0.    H/o antiphospholipid ab syndrome in MGM.  Mother and pt's testing has been negative.   Pt tested 11/2019.  Patient's last menstrual period was 04/02/2020 (exact date).          Sexually active: No.  Never sexually active.   The current method of family planning is abstinence.    Exercising: Yes.    walking Smoker:  no  Health Maintenance: Pap:  none History of abnormal Pap:  no MMG:  none TDaP:  2021 Screening Labs: done 02/2020   reports that she has never smoked. She has never used smokeless tobacco. She reports that she does not drink alcohol and does not use drugs.  Past Medical History:  Diagnosis Date  . Allergy   . Anemia     History reviewed. No pertinent surgical history.  Current Outpatient Medications  Medication Sig Dispense Refill  . MAGNESIUM PO Take by mouth.    Marland Kitchen VITAMIN D PO Take by mouth.     No current facility-administered medications for this visit.    Family History  Problem Relation Age of Onset  . Hyperthyroidism Mother   . Autoimmune disease Maternal Grandmother   . Hypertension Maternal Grandmother   . Diabetes Maternal Grandmother   . Congestive Heart Failure Maternal Grandmother   . Kidney disease Maternal Grandmother   . Stroke Maternal Grandmother   . Hyperlipidemia Maternal Grandmother   .  Hyperthyroidism Maternal Grandmother     Review of Systems  Constitutional: Negative.   HENT: Negative.   Eyes: Negative.   Respiratory: Negative.   Cardiovascular: Negative.   Gastrointestinal: Negative.   Endocrine: Negative.   Genitourinary:       Heavy menstrual cycles & clotting  Musculoskeletal: Negative.   Skin: Negative.   Allergic/Immunologic: Negative.   Neurological: Negative.   Hematological: Negative.   Psychiatric/Behavioral: Negative.     Exam:   BP 110/70   Pulse 68   Temp (!) 97.2 F (36.2 C) (Skin)   Resp 16   Ht 5' 6.25" (1.683 m)   Wt 125 lb (56.7 kg)   LMP 04/02/2020 (Exact Date)   BMI 20.02 kg/m   Height: 5' 6.25" (168.3 cm)  General appearance: alert, cooperative and appears stated age Head: Normocephalic, without obvious abnormality, atraumatic Neck: no adenopathy, supple, symmetrical, trachea midline and thyroid normal to inspection and palpation Lungs: clear to auscultation bilaterally Breasts: normal appearance, no masses or tenderness Heart: regular rate and rhythm, grade III systolic murmur noted Abdomen: soft, non-tender; bowel sounds normal; no masses,  no organomegaly Extremities: extremities normal, atraumatic, no cyanosis or edema Skin: Skin color, texture, turgor normal. No rashes or lesions Lymph nodes: Cervical, supraclavicular, and axillary nodes normal. No abnormal inguinal nodes palpated  Neurologic: Grossly normal   Pelvic: External genitalia:  no lesions              Urethra:  normal appearing urethra with no masses, tenderness or lesions              Bartholins and Skenes: normal                 Vagina: normal appearing vagina with normal color and discharge, no lesions              Cervix: no lesions              Pap taken: Yes.   Bimanual Exam:  Uterus:  enlarged, 8 week, nodular weeks size              Adnexa: normal adnexa and no mass, fullness, tenderness               Rectovaginal: Confirms               Anus:   normal sphincter tone, no lesions  Chaperone, Linda Yates, CMA, was present for exam.  A:  Well Woman with normal exam Menorrhagia Iron deficiency anemia Low ferritin  Systolic murmur that may be related to anemia  P:   Mammogram guidelines reviewed pap smear obtained Treatment options for menorrhagia discussed.  She really does not want to be on hormonal therapy.  Lysteda 1300mg  tid with onset of heavy cycle sent to pharmacy.  #30/2RF Iron/multivitamin with iron recommended Will need to recheck lab with and with pt in 3 months Referral to cardiology placed to Dr. Return annually or prn

## 2020-04-28 LAB — CYTOLOGY - PAP: Diagnosis: NEGATIVE

## 2020-04-29 ENCOUNTER — Telehealth: Payer: Self-pay

## 2020-04-29 ENCOUNTER — Encounter: Payer: Self-pay | Admitting: Obstetrics & Gynecology

## 2020-04-29 DIAGNOSIS — D500 Iron deficiency anemia secondary to blood loss (chronic): Secondary | ICD-10-CM | POA: Insufficient documentation

## 2020-04-29 DIAGNOSIS — R011 Cardiac murmur, unspecified: Secondary | ICD-10-CM | POA: Insufficient documentation

## 2020-04-29 NOTE — Telephone Encounter (Signed)
Linda Yates B  P [facility] Hi Dr. Hyacinth Meeker,   Just read over my results and your notes regarding my pap on Monday. I'm glad everything is normal and looks great! I do have a follow-up question however, I know that you suspected tiny fibroids. Based on what I read I didn't see those called out in the results. I just wanted a better understanding on if I actually have them or not and if so should I be concerned?   Thanks,  Linda Yates

## 2020-04-29 NOTE — Telephone Encounter (Signed)
Call returned to patient. Questions answered regarding 04/27/20 pap smear and fibroids. Patient verbalizes understanding. She plans to start Lysteda with onset of menses, instructions reviewed. Patient will see Dr. Hyacinth Meeker 07/28/20 for f/u. Is aware to call if any questions or concerns in the meantime. Patient thankful for call.   Routing to provider for final review. Patient is agreeable to disposition. Will close encounter.

## 2020-06-17 ENCOUNTER — Telehealth: Payer: Self-pay | Admitting: Obstetrics & Gynecology

## 2020-06-17 NOTE — Telephone Encounter (Signed)
Patient was seen as NGYN for AEX on 04/27/20.  No pending surgery.   Routing to [employer], Charity fundraiser

## 2020-06-17 NOTE — Telephone Encounter (Signed)
Routed to Dr. Hyacinth Meeker in error.  Routing to [employer], Charity fundraiser

## 2020-06-17 NOTE — Telephone Encounter (Signed)
Patient's employer faxed over FMLA paperwork and she would like to speak with nurse about. Paperwork in [place]avinia box outside her office.

## 2020-06-18 NOTE — Telephone Encounter (Signed)
Spoke with patient. Started Lysteda 1300 mg tid days 1-3 of last menses. Reports menses was not as heavy through the whole cycle as previous menses and less clots. Did notice increase in headaches. Will plan to keep OV as scheduled for f/u with Dr. Hyacinth Meeker on 07/28/20 at 4pm.   Patient is requesting FMLA forms to be completed to allow flexibility for scheduling doctors appointments without being penalized with her current employer. Has 2 upcoming appts, one at cardiologist and f/u with Dr. Hyacinth Meeker. Advised patient FMLA forms will likely not be completed, FMLA is typically for specific medical/health condition. Advised Dr. Hyacinth Meeker is out of the office this week, will review when she returns. Myself or Yvonna Alanis, RN will return call to f/u once reviewed. Patient agreeable.   Routing to Dr. Hyacinth Meeker  Cc: Nolen Mu, RN

## 2020-06-24 NOTE — Telephone Encounter (Signed)
Forwarding to [facility] to see if she will follow up with pt.  I've only seen this pt once so FMLA really isn't appropriate.  We will give notes for being at doctor's visits to be excused from work if that will be helpful but, again, I just don't think FMLA is appropriate at this point unless there is something else going on at work that we need to be aware of at this time.  Thanks.

## 2020-06-25 NOTE — Telephone Encounter (Signed)
Spoke with patient. Advised of message as seen below from Dr.Miller. Patient verbalizes understanding. Encounter closed. 

## 2020-07-02 ENCOUNTER — Ambulatory Visit: Admitting: Cardiovascular Disease

## 2020-07-14 ENCOUNTER — Other Ambulatory Visit: Payer: Self-pay

## 2020-07-14 DIAGNOSIS — Z20822 Contact with and (suspected) exposure to covid-19: Secondary | ICD-10-CM

## 2020-07-16 LAB — NOVEL CORONAVIRUS, NAA

## 2020-07-21 ENCOUNTER — Other Ambulatory Visit: Payer: Self-pay

## 2020-07-24 NOTE — Progress Notes (Deleted)
GYNECOLOGY  VISIT  CC:   ***  HPI: 29 y.o. G0P0000 Single Black or [place] American female here for follow up of lysteda.  GYNECOLOGIC HISTORY: No LMP recorded. Contraception: *** Menopausal hormone therapy: ***  Patient Active Problem List   Diagnosis Date Noted  . Systolic murmur 04/29/2020  . Iron deficiency anemia due to chronic blood loss 04/29/2020  . Allergic rhinitis due to pollen 02/10/2015    Past Medical History:  Diagnosis Date  . Allergy   . Anemia     No past surgical history on file.  MEDS:   Current Outpatient Medications on File Prior to Visit  Medication Sig Dispense Refill  . MAGNESIUM PO Take by mouth.    . tranexamic acid (LYSTEDA) 650 MG TABS tablet Take 2 tablets (1,300 mg total) by mouth 3 (three) times daily. 30 tablet 2  . VITAMIN D PO Take by mouth.     No current facility-administered medications on file prior to visit.    ALLERGIES: Prednisone  Family History  Problem Relation Age of Onset  . Hyperthyroidism Mother   . Autoimmune disease Maternal Grandmother        antiphospholipid ab syndrome  . Hypertension Maternal Grandmother   . Diabetes Maternal Grandmother   . Congestive Heart Failure Maternal Grandmother   . Kidney disease Maternal Grandmother   . Stroke Maternal Grandmother   . Hyperlipidemia Maternal Grandmother   . Hyperthyroidism Maternal Grandmother     SH:  ***  Review of Systems  PHYSICAL EXAMINATION:    There were no vitals taken for this visit.    General appearance: alert, cooperative and appears stated age Neck: no adenopathy, supple, symmetrical, trachea midline and thyroid {CHL AMB PHY EX THYROID NORM DEFAULT:[phone]::"normal to inspection and palpation"} CV:  {Exam; heart brief:31539} Lungs:  {pe lungs ob:314451::"clear to auscultation, no wheezes, rales or rhonchi, symmetric air entry"} Breasts: {Exam; breast:13139::"normal appearance, no masses or tenderness"} Abdomen: soft, non-tender; bowel  sounds normal; no masses,  no organomegaly Lymph:  no inguinal LAD noted  Pelvic: External genitalia:  no lesions              Urethra:  normal appearing urethra with no masses, tenderness or lesions              Bartholins and Skenes: normal                 Vagina: normal appearing vagina with normal color and discharge, no lesions              Cervix: {CHL AMB PHY EX CERVIX NORM DEFAULT:[phone]::"no lesions"}              Bimanual Exam:  Uterus:  {CHL AMB PHY EX UTERUS NORM DEFAULT:[phone]::"normal size, contour, position, consistency, mobility, non-tender"}              Adnexa: {CHL AMB PHY EX ADNEXA NO MASS [phone]::"no mass, fullness, tenderness"}              Rectovaginal: {yes no:[id number]}.  Confirms.              Anus:  normal sphincter tone, no lesions  Chaperone, ***Zenovia Jordan, CMA, was present for exam.  Assessment: ***  Plan: ***   {NUMBERS; -10-45 JOINT ROM:10287} minutes of total time was spent for this patient encounter, including preparation, face-to-face counseling with the patient and coordination of care, and documentation of the encounter.

## 2020-07-28 ENCOUNTER — Telehealth: Payer: Self-pay

## 2020-07-28 ENCOUNTER — Encounter: Payer: Self-pay | Admitting: Obstetrics & Gynecology

## 2020-07-28 ENCOUNTER — Ambulatory Visit: Payer: No Typology Code available for payment source | Admitting: Obstetrics & Gynecology

## 2020-07-28 NOTE — Telephone Encounter (Signed)
Patient cancelled office visit for today due to insurance. Need triage to assist in reschedule due to patient needing to be seen after Nov. !.

## 2020-07-28 NOTE — Telephone Encounter (Signed)
AEX 04/2020 H/o menorrhagia  Iron def anemia Low ferritin   Spoke with pt. Pt states was started on Lysteda after AEX in June. Pt states having lighter, regular monthly cycles since taking Lysteda for the past 3 months. Pt states cannot come for 3 month follow up now due to new job and insurance changes until after Nov 1. Pt states update from taking Lysteda Rx  is having cramps and headaches while having cycles, but having much lighter periods and no clots.  Pt rescheduled for 11/2 at 9 am with Dr Hyacinth Meeker. Pt agreeable to date and time of appt. Pt advised will update Dr Hyacinth Meeker and return call with any additional recommendations/advice. Pt agreeable.   Pt wanting to know if needs follow up appt? And does she need to take Lysteda Rx longer than first 3 days of cycle to help prevent headaches?   Pt is also currently out of Lysteda Rx, does she need refills?   Routing to Dr Hyacinth Meeker, please advise.

## 2020-07-29 ENCOUNTER — Other Ambulatory Visit: Payer: Self-pay | Admitting: Obstetrics & Gynecology

## 2020-07-29 DIAGNOSIS — D500 Iron deficiency anemia secondary to blood loss (chronic): Secondary | ICD-10-CM

## 2020-07-29 MED ORDER — TRANEXAMIC ACID 650 MG PO TABS: 1300.0000 mg | ORAL_TABLET | Freq: Three times a day (TID) | ORAL | 10 refills | Status: DC

## 2020-07-29 NOTE — Telephone Encounter (Signed)
If bleeding is a lot better, then no I don't think she needs a follow up.  Lysteda is taken 2 tabs three times daily started with bleeding.  It should not be taken for more than 5 days each cycle, however it can be stopped when bleeding improves.  So, for example, if this is on day #3, she can stop the medication.  She did have anemia with last hb of 10 and iron deficiency.  I would like to check her labs again.  Future lab order placed.  Ok to just change appt to lab appt and not recheck.  I'm glad she is better.  Thanks.

## 2020-07-29 NOTE — Telephone Encounter (Signed)
Spoke with pt. Pt given update from Dr Hyacinth Meeker. Pt agreeable and verbalized understanding. Pt states would like to keep OV to discuss further about fibroids and future care and will repeat labs at this time. Pt has OV on 11/2 at 9 am. Pt verbalized understanding. Pt aware of Rx refills sent to pharmacy on file.  Encounter closed.

## 2020-08-04 ENCOUNTER — Encounter: Payer: Self-pay | Admitting: General Practice

## 2020-08-05 ENCOUNTER — Ambulatory Visit: Payer: No Typology Code available for payment source | Admitting: Cardiovascular Disease

## 2020-09-07 ENCOUNTER — Telehealth: Payer: Self-pay

## 2020-09-07 NOTE — Telephone Encounter (Signed)
Spoke with pt. Pt states needing to reschedule med check update due to work schedule and new insurance that started today. Pt states took Lysteda Rx in July and August and is seeing improvement in bleeding with cycles and having less clots. Pt denies feeling weak, dizzy or lightheaded. Pt states would like to take more Lysteda Rx to have more of an actual account of how its helping or improving cycles. Pt states will call pharmacy today and pick up refill for Lysteda. Pt advised per Dr Hyacinth Meeker to still have lab appt for follow up labs for CBC and Iron studies. Pt agreeable. Pt scheduled for lab appt on 11/4 at 115 pm. Pt agreeable to date and time of appt.  Pt advised to have OV to discuss plan of care before Dr Hyacinth Meeker leaves. Pt agreeable. Pt scheduled for med check follow up on 11/18 at 2 pm. Pt agreeable to date and time of appt.   Routing to Dr Hyacinth Meeker for review Encounter closed Future labs ordered by Dr Hyacinth Meeker on 07/29/20.

## 2020-09-07 NOTE — Telephone Encounter (Signed)
Patient is calling to see if medcheck appointment is needed. Patient states she has not taken the medication since August due to insurance.

## 2020-09-08 ENCOUNTER — Ambulatory Visit: Payer: Self-pay | Admitting: Obstetrics & Gynecology

## 2020-09-10 ENCOUNTER — Other Ambulatory Visit (INDEPENDENT_AMBULATORY_CARE_PROVIDER_SITE_OTHER): Payer: 59

## 2020-09-10 ENCOUNTER — Other Ambulatory Visit: Payer: Self-pay

## 2020-09-10 DIAGNOSIS — D500 Iron deficiency anemia secondary to blood loss (chronic): Secondary | ICD-10-CM

## 2020-09-11 LAB — CBC
Hematocrit: 32.1 % — ABNORMAL LOW (ref 34.0–46.6)
Hemoglobin: 9.5 g/dL — ABNORMAL LOW (ref 11.1–15.9)
MCH: 21.4 pg — ABNORMAL LOW (ref 26.6–33.0)
MCHC: 29.6 g/dL — ABNORMAL LOW (ref 31.5–35.7)
MCV: 72 fL — ABNORMAL LOW (ref 79–97)
Platelets: 359 10*3/uL (ref 150–450)
RBC: 4.44 x10E6/uL (ref 3.77–5.28)
RDW: 13 % (ref 11.7–15.4)
WBC: 5.5 10*3/uL (ref 3.4–10.8)

## 2020-09-11 LAB — IRON,TIBC AND FERRITIN PANEL
Ferritin: 7 ng/mL — ABNORMAL LOW (ref 15–150)
Iron Saturation: 11 % — ABNORMAL LOW (ref 15–55)
Iron: 41 ug/dL (ref 27–159)
Total Iron Binding Capacity: 384 ug/dL (ref 250–450)
UIBC: 343 ug/dL (ref 131–425)

## 2020-09-16 ENCOUNTER — Telehealth: Payer: Self-pay | Admitting: Hematology

## 2020-09-16 ENCOUNTER — Telehealth: Payer: Self-pay

## 2020-09-16 NOTE — Telephone Encounter (Signed)
Received a new hem referral from Dr. Hyacinth Meeker for IDA. Pt has been cld and scheduled to see Dr. Candise Che on 11/16 at 1pm. Pt aware to arrive 30 minutes early.

## 2020-09-16 NOTE — Telephone Encounter (Signed)
Spoke with pt. Pt asking about having iron infusion. Pt states was called today by hematology to schedule iron infusion. Pt states wanting to know if iron infusion is necessary right now and if can take iron supplements and see if levels change.  Pt advised and encouraged iron infusion based on Dr Rondel Baton result note on 11/5. Pt agreeable and will send mychart message with hematology appt for update. Pt thankful for advice.  Encounter closed

## 2020-09-16 NOTE — Telephone Encounter (Signed)
Patient has follow up questions for nurse. °

## 2020-09-22 ENCOUNTER — Other Ambulatory Visit: Payer: Self-pay

## 2020-09-22 ENCOUNTER — Inpatient Hospital Stay: Payer: 59 | Attending: Hematology | Admitting: Hematology

## 2020-09-22 VITALS — BP 121/65 | HR 80 | Temp 97.9°F | Resp 18 | Ht 66.25 in | Wt 135.4 lb

## 2020-09-22 DIAGNOSIS — R197 Diarrhea, unspecified: Secondary | ICD-10-CM | POA: Insufficient documentation

## 2020-09-22 DIAGNOSIS — D500 Iron deficiency anemia secondary to blood loss (chronic): Secondary | ICD-10-CM

## 2020-09-22 DIAGNOSIS — R110 Nausea: Secondary | ICD-10-CM | POA: Diagnosis not present

## 2020-09-22 DIAGNOSIS — Z86018 Personal history of other benign neoplasm: Secondary | ICD-10-CM | POA: Insufficient documentation

## 2020-09-22 DIAGNOSIS — D509 Iron deficiency anemia, unspecified: Secondary | ICD-10-CM | POA: Diagnosis not present

## 2020-09-22 MED ORDER — POLYSACCHARIDE IRON COMPLEX 150 MG PO CAPS: 150.0000 mg | ORAL_CAPSULE | Freq: Every day | ORAL | 5 refills | Status: DC

## 2020-09-22 NOTE — Progress Notes (Signed)
HEMATOLOGY/ONCOLOGY CONSULTATION NOTE  Date of Service: 09/22/2020  Patient Care Team: Dorothyann Peng, MD as PCP - General (Internal Medicine)  CHIEF COMPLAINTS/PURPOSE OF CONSULTATION:  IDA  HISTORY OF PRESENTING ILLNESS:   Linda Yates is a wonderful 29 y.o. female who has been referred to [place] by Dr. Hyacinth Meeker for evaluation and management of iron deficiency anemia. The pt reports that she is doing well overall.   The pt reports that she was first told that she was anemic a few years ago. Over the last few years she has noticed heavier menstrual cycles with heavy clotting. She was recently found to have small uterine fibroids and was placed on Lysteda for a few months. Her menstrual cycles were shorter with Lysteda, but no other changes were observed. Pt has not tried any forms of birth control or IUDs. She is interested in having children soon, but is not actively trying for a child. Pt has tried OTC multivitamins with iron and other forms of OTC iron but experienced nausea and diarrhea. She eats a fairly balanced diet and denies any gluten intolerance. She denies any other medical problems or known drug allergies. Pt had a wisdom tooth extraction and had no excessive bleeding. She is taking OTC Magnesium to prevent leg cramps.   Most recent lab results (09/10/2020) of CBC is as follows: all values are WNL except for Hgb at 9.5, HCT at 32.1, MCV at 72, MCH at 21.4, MCHC at 29.6. 09/10/2020 Iron, TIBC and Ferritin Panel shows: TIBC at 384, UIBC at 343, Iron at 41, Iron Sat at 11, Ferritin at 7.  On review of systems, pt reports muscle cramps, heavy menstrual cycles and denies bruising, hematuria, bloody stools, nose bleeds, gum bleeds, abdominal pain, leg swelling and any other symptoms.   On PMHx the pt reports Anemia, Wisdom Tooth Extraction. On Social Hx the pt reports that she is a non-smoker and does not drink much alcohol outside of social situations.  On Family Hx the pt reports  that her maternal grandmother had antiphospholipid antibodies and suffered from strokes.   MEDICAL HISTORY:  Past Medical History:  Diagnosis Date  . Allergy   . Anemia     SURGICAL HISTORY: No past surgical history on file.  SOCIAL HISTORY: Social History   Socioeconomic History  . Marital status: Single    Spouse name: Not on file  . Number of children: Not on file  . Years of education: Not on file  . Highest education level: Not on file  Occupational History  . Not on file  Tobacco Use  . Smoking status: Never Smoker  . Smokeless tobacco: Never Used  Substance and Sexual Activity  . Alcohol use: Never    Alcohol/week: 0.0 standard drinks  . Drug use: Never  . Sexual activity: Never  Other Topics Concern  . Not on file  Social History Narrative  . Not on file   Social Determinants of Health   Financial Resource Strain:   . Difficulty of Paying Living Expenses: Not on file  Food Insecurity:   . Worried About Programme researcher, broadcasting/film/video in the Last Year: Not on file  . Ran Out of Food in the Last Year: Not on file  Transportation Needs:   . Lack of Transportation (Medical): Not on file  . Lack of Transportation (Non-Medical): Not on file  Physical Activity:   . Days of Exercise per Week: Not on file  . Minutes of Exercise per Session: Not on  file  Stress:   . Feeling of Stress : Not on file  Social Connections:   . Frequency of Communication with Friends and Family: Not on file  . Frequency of Social Gatherings with Friends and Family: Not on file  . Attends Religious Services: Not on file  . Active Member of Clubs or Organizations: Not on file  . Attends Banker Meetings: Not on file  . Marital Status: Not on file  Intimate Partner Violence:   . Fear of Current or Ex-Partner: Not on file  . Emotionally Abused: Not on file  . Physically Abused: Not on file  . Sexually Abused: Not on file    FAMILY HISTORY: Family History  Problem Relation Age  of Onset  . Hyperthyroidism Mother   . Autoimmune disease Maternal Grandmother        antiphospholipid ab syndrome  . Hypertension Maternal Grandmother   . Diabetes Maternal Grandmother   . Congestive Heart Failure Maternal Grandmother   . Kidney disease Maternal Grandmother   . Stroke Maternal Grandmother   . Hyperlipidemia Maternal Grandmother   . Hyperthyroidism Maternal Grandmother     ALLERGIES:  is allergic to prednisone.  MEDICATIONS:  Current Outpatient Medications  Medication Sig Dispense Refill  . iron polysaccharides (NIFEREX) 150 MG capsule Take 1 capsule (150 mg total) by mouth daily. 30 capsule 5  . MAGNESIUM PO Take by mouth.    . tranexamic acid (LYSTEDA) 650 MG TABS tablet Take 2 tablets (1,300 mg total) by mouth 3 (three) times daily. Stop when bleeding starts to improved.  Only take for up to 5 days each cycle. 30 tablet 10  . VITAMIN D PO Take by mouth.     No current facility-administered medications for this visit.    REVIEW OF SYSTEMS:    10 Point review of Systems was done is negative except as noted above.  PHYSICAL EXAMINATION: ECOG PERFORMANCE STATUS: 1 - Symptomatic but completely ambulatory  . Vitals:   09/22/20 1325  BP: 121/65  Pulse: 80  Resp: 18  Temp: 97.9 F (36.6 C)  SpO2: 100%   Filed Weights   09/22/20 1325  Weight: 135 lb 6.4 oz (61.4 kg)   .Body mass index is 21.69 kg/m.  GENERAL:alert, in no acute distress and comfortable SKIN: no acute rashes, no significant lesions EYES: conjunctiva are pink and non-injected, sclera anicteric OROPHARYNX: MMM, no exudates, no oropharyngeal erythema or ulceration NECK: supple, no JVD LYMPH:  no palpable lymphadenopathy in the cervical, axillary or inguinal regions LUNGS: clear to auscultation b/l with normal respiratory effort HEART: regular rate & rhythm ABDOMEN:  normoactive bowel sounds , non tender, not distended. Extremity: no pedal edema PSYCH: alert & oriented x 3 with fluent  speech NEURO: no focal motor/sensory deficits  LABORATORY DATA:  I have reviewed the data as listed  . CBC Latest Ref Rng & Units 09/10/2020 02/12/2020 02/20/2017  WBC 3.4 - 10.8 x10E3/uL 5.5 7.3 7.1  Hemoglobin 11.1 - 15.9 g/dL 3.3(I) 10.0(L) 10.6(A)  Hematocrit 34.0 - 46.6 % 32.1(L) 34.7 32.8(A)  Platelets 150 - 450 x10E3/uL 359 361 -   . CBC    Component Value Date/Time   WBC 5.5 09/10/2020 1353   WBC 7.1 02/20/2017 1609   WBC 11.1 (H) 02/16/2015 1908   RBC 4.44 09/10/2020 1353   RBC 4.68 02/20/2017 1609   RBC 4.93 02/16/2015 1908   HGB 9.5 (L) 09/10/2020 1353   HCT 32.1 (L) 09/10/2020 1353   PLT 359 09/10/2020  1353   MCV 72 (L) 09/10/2020 1353   MCH 21.4 (L) 09/10/2020 1353   MCH 22.5 (A) 02/20/2017 1609   MCH 21.7 (L) 02/16/2015 1908   MCHC 29.6 (L) 09/10/2020 1353   MCHC 32.2 02/20/2017 1609   MCHC 30.7 02/16/2015 1908   RDW 13.0 09/10/2020 1353    . CMP Latest Ref Rng & Units 02/12/2020 02/16/2015  Glucose 65 - 99 mg/dL 75 92  BUN 6 - 20 mg/dL 15 12  Creatinine 6.64 - 1.00 mg/dL 4.03 4.74  Sodium 259 - 144 mmol/L 139 137  Potassium 3.5 - 5.2 mmol/L 4.2 3.6  Chloride 96 - 106 mmol/L 105 106  CO2 20 - 29 mmol/L 21 24  Calcium 8.7 - 10.2 mg/dL 9.2 9.0  Total Protein 6.0 - 8.5 g/dL 7.6 -  Total Bilirubin 0.0 - 1.2 mg/dL 0.3 -  Alkaline Phos 39 - 117 IU/L 41 -  AST 0 - 40 IU/L 16 -  ALT 0 - 32 IU/L 12 -   . Lab Results  Component Value Date   IRON 41 09/10/2020   TIBC 384 09/10/2020   IRONPCTSAT 11 (L) 09/10/2020   (Iron and TIBC)  Lab Results  Component Value Date   FERRITIN 7 (L) 09/10/2020     RADIOGRAPHIC STUDIES: I have personally reviewed the radiological images as listed and agreed with the findings in the report. No results found.  ASSESSMENT & PLAN:   29 yo with   1) Severe Iron deficiency with microcytic anemia related to heavy menstrual losses  PLAN: -Discussed patient's most recent labs from 09/10/2020, all values are WNL except for  Hgb at 9.5, HCT at 32.1, MCV at 72, MCH at 21.4, MCHC at 29.6, Iron Sat at 11, Ferritin at 7. -Advised pt that microcytosis suggests chronic iron deficiency.  -Advised pt that a blood transfusion may be required if Hgb <8.  -Advised pt that it may take 6-12 months to correct her iron deficiency with PO Iron, provided her menstrual cycles are well-controlled.  -Advised pt that the highest risk with IV iron is an allergic reaction, but that we premedicate to prevent this. -Advised pt that PO Iron can be used as maintenance after iron has been well-replaced IV, if tolerated well.  -Recommend pt take 150 mg Iron Polysaccharide BID with food.  -Recommend a daily B-complex vitamin. -Goal Ferritin >50, Goal Iron Sat >30 -Will give IV Injectafer weekly x2 -Rx Iron Polysaccharide -Will see back in 3 months with labs   FOLLOW UP: IV Injectafer weekly x 2 asap RTC with Dr Candise Che with labs in 3 months   All of the patients questions were answered with apparent satisfaction. The patient knows to call the clinic with any problems, questions or concerns.  I spent counseling the patient face to face. The total time spent in the appointment was 45 minutes and more than 50% was on counseling and direct patient cares.    Wyvonnia Lora MD MS AAHIVMS [facility] Hematology/Oncology Physician [facility]  (Office):       [phone] (Work cell):  [phone] (Fax):           [phone]  09/22/2020 2:05 PM  I, Carollee Herter, am acting as a scribe for Dr. Wyvonnia Lora.   .I have reviewed the above documentation for accuracy and completeness, and I agree with the above. Johney Maine MD

## 2020-09-22 NOTE — Patient Instructions (Signed)
Thank you for choosing [facility] to provide your oncology and hematology care.   Should you have questions after your visit to the [facility] ([facility]), please contact this office at [phone] between 8:30 AM and 4:30 PM.  Voice mails left after 4:00 PM may not be returned until the following business day.  Calls received after 4:30 PM will be answered by an off-site Nurse Triage Line.    Prescription Refills:  Please have your pharmacy contact us directly for most prescription requests.  Contact the office directly for refills of narcotics (pain medications). Allow 48-72 hours for refills.  Appointments: Please contact the [facility] scheduling department [phone] for questions regarding [facility] appointment scheduling.  Contact the schedulers with any scheduling changes so that your appointment can be rescheduled in a timely manner.   Central Scheduling for [place] [phone] - Call to schedule procedures such as PET scans, CT scans, MRI, Ultrasound, etc.  To afford each patient quality time with our providers, please arrive 30 minutes before your scheduled appointment time.  If you arrive late for your appointment, you may be asked to reschedule.  We strive to give you quality time with our providers, and arriving late affects you and other patients whose appointments are after yours. If you are a no show for multiple scheduled visits, you may be dismissed from the clinic at the providers discretion.     Resources: [facility] Social Workers [phone] for additional information on assistance programs or assistance connecting with community support programs   [place] DSS  [phone]: Information regarding food stamps, Medicaid, and utility assistance [place] [employer] [place] [phone]   [place] [employer]'s shared-ride transportation service for eligible riders who have a disability that prevents them from riding the fixed route bus.   Medicare  Rights Center 800-333-4114 Helps people with Medicare understand their rights and benefits, navigate the Medicare system, and secure the quality healthcare they deserve [employer] 800-227-2345 Assists patients locate various types of support and financial assistance Cancer Care: 1-800-813-HOPE (4673) Provides financial assistance, online support groups, medication/co-pay assistance.   Transportation Assistance for appointments at [facility]: Transportation Coordinator [phone]  Again, thank you for choosing [facility] for your care.       

## 2020-09-24 ENCOUNTER — Ambulatory Visit: Payer: Self-pay | Admitting: Obstetrics & Gynecology

## 2020-09-24 ENCOUNTER — Telehealth: Payer: Self-pay | Admitting: Hematology

## 2020-09-24 NOTE — Telephone Encounter (Signed)
Scheduled per los, patient has been called and notified. Rescheduled November appointments to December per patient's request. Informed provider.

## 2020-09-25 ENCOUNTER — Other Ambulatory Visit: Payer: 59

## 2020-09-25 ENCOUNTER — Inpatient Hospital Stay: Payer: 59

## 2020-09-25 DIAGNOSIS — Z20822 Contact with and (suspected) exposure to covid-19: Secondary | ICD-10-CM

## 2020-09-26 LAB — SARS-COV-2, NAA 2 DAY TAT

## 2020-09-26 LAB — NOVEL CORONAVIRUS, NAA: SARS-CoV-2, NAA: NOT DETECTED

## 2020-10-03 ENCOUNTER — Ambulatory Visit: Payer: 59

## 2020-10-16 ENCOUNTER — Other Ambulatory Visit: Payer: Self-pay

## 2020-10-16 ENCOUNTER — Inpatient Hospital Stay: Payer: 59 | Attending: Hematology

## 2020-10-16 VITALS — BP 109/67 | HR 73 | Temp 98.9°F | Resp 16 | Ht 65.0 in | Wt 134.5 lb

## 2020-10-16 DIAGNOSIS — D500 Iron deficiency anemia secondary to blood loss (chronic): Secondary | ICD-10-CM

## 2020-10-16 DIAGNOSIS — Z79899 Other long term (current) drug therapy: Secondary | ICD-10-CM | POA: Diagnosis not present

## 2020-10-16 DIAGNOSIS — D509 Iron deficiency anemia, unspecified: Secondary | ICD-10-CM | POA: Insufficient documentation

## 2020-10-16 MED ORDER — SODIUM CHLORIDE 0.9 % IV SOLN
750.0000 mg | Freq: Once | INTRAVENOUS | Status: AC
  Administered 2020-10-16: 750 mg via INTRAVENOUS
  Filled 2020-10-16: qty 15

## 2020-10-16 MED ORDER — EPINEPHRINE 0.3 MG/0.3ML IJ SOAJ: 0.3000 mg | Freq: Once | INTRAMUSCULAR | Status: DC | PRN

## 2020-10-16 MED ORDER — METHYLPREDNISOLONE SODIUM SUCC 125 MG IJ SOLR: 125.0000 mg | Freq: Once | INTRAMUSCULAR | Status: DC | PRN

## 2020-10-16 MED ORDER — LORATADINE 10 MG PO TABS
ORAL_TABLET | ORAL | Status: AC
  Filled 2020-10-16: qty 1

## 2020-10-16 MED ORDER — ALBUTEROL SULFATE (2.5 MG/3ML) 0.083% IN NEBU
2.5000 mg | INHALATION_SOLUTION | Freq: Once | RESPIRATORY_TRACT | Status: DC | PRN
  Filled 2020-10-16: qty 3

## 2020-10-16 MED ORDER — DIPHENHYDRAMINE HCL 50 MG/ML IJ SOLN: 50.0000 mg | Freq: Once | INTRAMUSCULAR | Status: DC | PRN

## 2020-10-16 MED ORDER — SODIUM CHLORIDE 0.9 % IV SOLN
Freq: Once | INTRAVENOUS | Status: DC | PRN
  Filled 2020-10-16: qty 250

## 2020-10-16 MED ORDER — ACETAMINOPHEN 325 MG PO TABS
ORAL_TABLET | ORAL | Status: AC
  Filled 2020-10-16: qty 2

## 2020-10-16 MED ORDER — LORATADINE 10 MG PO TABS
10.0000 mg | ORAL_TABLET | Freq: Once | ORAL | Status: AC
  Administered 2020-10-16: 10 mg via ORAL

## 2020-10-16 MED ORDER — FAMOTIDINE IN NACL 20-0.9 MG/50ML-% IV SOLN: 20.0000 mg | Freq: Once | INTRAVENOUS | Status: DC | PRN

## 2020-10-16 MED ORDER — ACETAMINOPHEN 325 MG PO TABS
650.0000 mg | ORAL_TABLET | Freq: Once | ORAL | Status: AC
  Administered 2020-10-16: 650 mg via ORAL

## 2020-10-16 MED ORDER — SODIUM CHLORIDE 0.9 % IV SOLN
Freq: Once | INTRAVENOUS | Status: AC
  Filled 2020-10-16: qty 250

## 2020-10-16 NOTE — Patient Instructions (Signed)

## 2020-10-23 ENCOUNTER — Inpatient Hospital Stay: Payer: 59

## 2020-10-23 ENCOUNTER — Other Ambulatory Visit: Payer: Self-pay

## 2020-10-23 VITALS — BP 98/58 | HR 77 | Temp 98.3°F | Resp 17

## 2020-10-23 DIAGNOSIS — D509 Iron deficiency anemia, unspecified: Secondary | ICD-10-CM | POA: Diagnosis not present

## 2020-10-23 DIAGNOSIS — D500 Iron deficiency anemia secondary to blood loss (chronic): Secondary | ICD-10-CM

## 2020-10-23 MED ORDER — SODIUM CHLORIDE 0.9 % IV SOLN
Freq: Once | INTRAVENOUS | Status: AC
  Filled 2020-10-23: qty 250

## 2020-10-23 MED ORDER — SODIUM CHLORIDE 0.9 % IV SOLN
750.0000 mg | Freq: Once | INTRAVENOUS | Status: AC
  Administered 2020-10-23: 750 mg via INTRAVENOUS
  Filled 2020-10-23: qty 15

## 2020-10-23 MED ORDER — LORATADINE 10 MG PO TABS
10.0000 mg | ORAL_TABLET | Freq: Once | ORAL | Status: AC
  Administered 2020-10-23: 10 mg via ORAL

## 2020-10-23 MED ORDER — ACETAMINOPHEN 325 MG PO TABS
650.0000 mg | ORAL_TABLET | Freq: Once | ORAL | Status: AC
  Administered 2020-10-23: 650 mg via ORAL

## 2020-10-23 MED ORDER — LORATADINE 10 MG PO TABS
ORAL_TABLET | ORAL | Status: AC
  Filled 2020-10-23: qty 1

## 2020-10-23 MED ORDER — ACETAMINOPHEN 325 MG PO TABS
ORAL_TABLET | ORAL | Status: AC
  Filled 2020-10-23: qty 2

## 2020-10-23 NOTE — Patient Instructions (Signed)

## 2020-10-26 ENCOUNTER — Other Ambulatory Visit: Payer: Self-pay

## 2020-10-29 ENCOUNTER — Other Ambulatory Visit: Payer: 59

## 2020-11-08 ENCOUNTER — Encounter: Payer: Self-pay | Admitting: Internal Medicine

## 2020-11-27 ENCOUNTER — Other Ambulatory Visit: Payer: Self-pay

## 2020-11-27 ENCOUNTER — Other Ambulatory Visit: Payer: 59

## 2020-11-27 DIAGNOSIS — Z20822 Contact with and (suspected) exposure to covid-19: Secondary | ICD-10-CM

## 2020-11-29 LAB — SARS-COV-2, NAA 2 DAY TAT

## 2020-11-29 LAB — SPECIMEN STATUS REPORT

## 2020-11-29 LAB — NOVEL CORONAVIRUS, NAA: SARS-CoV-2, NAA: NOT DETECTED

## 2020-12-03 ENCOUNTER — Other Ambulatory Visit: Payer: 59

## 2020-12-03 DIAGNOSIS — Z20822 Contact with and (suspected) exposure to covid-19: Secondary | ICD-10-CM

## 2020-12-04 LAB — SARS-COV-2, NAA 2 DAY TAT

## 2020-12-04 LAB — NOVEL CORONAVIRUS, NAA: SARS-CoV-2, NAA: NOT DETECTED

## 2020-12-22 NOTE — Progress Notes (Signed)
HEMATOLOGY/ONCOLOGY CONSULTATION NOTE  Date of Service: 12/23/2020  Patient Care Team: Dorothyann Peng, MD as PCP - General (Internal Medicine)  CHIEF COMPLAINTS/PURPOSE OF CONSULTATION:  IDA  HISTORY OF PRESENTING ILLNESS:   Linda Yates is a wonderful 30 y.o. female who has been referred to [place] by Dr. Hyacinth Meeker for evaluation and management of iron deficiency anemia. The pt reports that she is doing well overall.   The pt reports that she was first told that she was anemic a few years ago. Over the last few years she has noticed heavier menstrual cycles with heavy clotting. She was recently found to have small uterine fibroids and was placed on Lysteda for a few months. Her menstrual cycles were shorter with Lysteda, but no other changes were observed. Pt has not tried any forms of birth control or IUDs. She is interested in having children soon, but is not actively trying for a child. Pt has tried OTC multivitamins with iron and other forms of OTC iron but experienced nausea and diarrhea. She eats a fairly balanced diet and denies any gluten intolerance. She denies any other medical problems or known drug allergies. Pt had a wisdom tooth extraction and had no excessive bleeding. She is taking OTC Magnesium to prevent leg cramps.   Most recent lab results (09/10/2020) of CBC is as follows: all values are WNL except for Hgb at 9.5, HCT at 32.1, MCV at 72, MCH at 21.4, MCHC at 29.6. 09/10/2020 Iron, TIBC and Ferritin Panel shows: TIBC at 384, UIBC at 343, Iron at 41, Iron Sat at 11, Ferritin at 7.  On review of systems, pt reports muscle cramps, heavy menstrual cycles and denies bruising, hematuria, bloody stools, nose bleeds, gum bleeds, abdominal pain, leg swelling and any other symptoms.   On PMHx the pt reports Anemia, Wisdom Tooth Extraction. On Social Hx the pt reports that she is a non-smoker and does not drink much alcohol outside of social situations.  On Family Hx the pt reports  that her maternal grandmother had antiphospholipid antibodies and suffered from strokes.   INTERVAL HISTORY  Linda Yates is a wonderful 30 y.o. female who is here today for evaluation and management of iron deficiency anemia. The patient's last visit with [place] was on 09/22/2020. The pt reports that she is doing well overall.  The pt reports that she has felt the same since last visit. She received the IV iron (1.5 g IV Injectafer) and taking Iron Polysaccharide once daily. The pt notes that she takes Lysteda during the first three days of her periods and this is helping with the heavy bleeding. The pt notes her periods are 6-8 days. She notes much less clotting during her periods. She is feeling well at this time.The pt notes that she was not having ice cravings or random food cravings prior to IV iron. The pt notes that she still feels very fatigued at this time and notes no improvement following the IV iron.   The pt notes that over the last week the pt is feeling much more fatigued and sore. She notes weakness. The pt also notes that she had her period last week and could be the reason for this. The pt notes that her OBGYN noted some small fibroids, but was unconcerned regarding these at this time. She has a f/u appt later this month. Her OBGYN also noted a small heart murmur.    Lab results today 12/23/2020 of CBC w/diff and CMP is as  follows: all values are WNL except for Hgb of 11.6, MCV of 75.9, MCH of 22.7, ALT of 76, Total Bilirubin of <0.2.  12/23/2020 Ferritin pending. 12/23/2020 Iron pending.  On review of systems, pt reports fatigue, weakness and denies decreased appetite, unexpected weight loss, abdominal pain, back pain, leg swelling and any other symptoms.  MEDICAL HISTORY:  Past Medical History:  Diagnosis Date  . Allergy   . Anemia     SURGICAL HISTORY: No past surgical history on file.  SOCIAL HISTORY: Social History   Socioeconomic History  . Marital status:  Single    Spouse name: Not on file  . Number of children: Not on file  . Years of education: Not on file  . Highest education level: Not on file  Occupational History  . Not on file  Tobacco Use  . Smoking status: Never Smoker  . Smokeless tobacco: Never Used  Substance and Sexual Activity  . Alcohol use: Never    Alcohol/week: 0.0 standard drinks  . Drug use: Never  . Sexual activity: Never  Other Topics Concern  . Not on file  Social History Narrative  . Not on file   Social Determinants of Health   Financial Resource Strain: Not on file  Food Insecurity: Not on file  Transportation Needs: Not on file  Physical Activity: Not on file  Stress: Not on file  Social Connections: Not on file  Intimate Partner Violence: Not on file    FAMILY HISTORY: Family History  Problem Relation Age of Onset  . Hyperthyroidism Mother   . Autoimmune disease Maternal Grandmother        antiphospholipid ab syndrome  . Hypertension Maternal Grandmother   . Diabetes Maternal Grandmother   . Congestive Heart Failure Maternal Grandmother   . Kidney disease Maternal Grandmother   . Stroke Maternal Grandmother   . Hyperlipidemia Maternal Grandmother   . Hyperthyroidism Maternal Grandmother     ALLERGIES:  is allergic to prednisone.  MEDICATIONS:  Current Outpatient Medications  Medication Sig Dispense Refill  . iron polysaccharides (NIFEREX) 150 MG capsule Take 1 capsule (150 mg total) by mouth daily. 30 capsule 5  . MAGNESIUM PO Take by mouth.    . tranexamic acid (LYSTEDA) 650 MG TABS tablet Take 2 tablets (1,300 mg total) by mouth 3 (three) times daily. Stop when bleeding starts to improved.  Only take for up to 5 days each cycle. 30 tablet 10  . VITAMIN D PO Take by mouth.     No current facility-administered medications for this visit.    REVIEW OF SYSTEMS:   10 Point review of Systems was done is negative except as noted above.  PHYSICAL EXAMINATION: ECOG PERFORMANCE STATUS:  1 - Symptomatic but completely ambulatory  . Vitals:   12/23/20 0916  BP: 110/67  Pulse: 66  Resp: 18  Temp: (!) 96.3 F (35.7 C)  SpO2: 100%   Filed Weights   12/23/20 0916  Weight: 133 lb 8 oz (60.6 kg)   .Body mass index is 22.22 kg/m.   GENERAL:alert, in no acute distress and comfortable SKIN: no acute rashes, no significant lesions EYES: conjunctiva are pink and non-injected, sclera anicteric OROPHARYNX: MMM, no exudates, no oropharyngeal erythema or ulceration NECK: supple, no JVD LYMPH:  no palpable lymphadenopathy in the cervical, axillary or inguinal regions LUNGS: clear to auscultation b/l with normal respiratory effort HEART: regular rate & rhythm ABDOMEN:  normoactive bowel sounds , non tender, not distended. Extremity: no pedal edema PSYCH: alert &  oriented x 3 with fluent speech NEURO: no focal motor/sensory deficits  LABORATORY DATA:  I have reviewed the data as listed  . CBC Latest Ref Rng & Units 12/23/2020 09/10/2020 02/12/2020  WBC 4.0 - 10.5 K/uL 6.3 5.5 7.3  Hemoglobin 12.0 - 15.0 g/dL 11.6(L) 9.5(L) 10.0(L)  Hematocrit 36.0 - 46.0 % 38.7 32.1(L) 34.7  Platelets 150 - 400 K/uL 261 359 361   . CBC    Component Value Date/Time   WBC 6.3 12/23/2020 0859   RBC 5.10 12/23/2020 0859   HGB 11.6 (L) 12/23/2020 0859   HGB 9.5 (L) 09/10/2020 1353   HCT 38.7 12/23/2020 0859   HCT 32.1 (L) 09/10/2020 1353   PLT 261 12/23/2020 0859   PLT 359 09/10/2020 1353   MCV 75.9 (L) 12/23/2020 0859   MCV 72 (L) 09/10/2020 1353   MCH 22.7 (L) 12/23/2020 0859   MCHC 30.0 12/23/2020 0859   RDW 15.0 12/23/2020 0859   RDW 13.0 09/10/2020 1353   LYMPHSABS 2.0 12/23/2020 0859   MONOABS 0.5 12/23/2020 0859   EOSABS 0.1 12/23/2020 0859   BASOSABS 0.1 12/23/2020 0859    . CMP Latest Ref Rng & Units 12/23/2020 02/12/2020 02/16/2015  Glucose 70 - 99 mg/dL 87 75 92  BUN 6 - 20 mg/dL 12 15 12   Creatinine 0.44 - 1.00 mg/dL 6.96 2.95  Sodium 135 - 145 mmol/L 140 139  137  Potassium 3.5 - 5.1 mmol/L 4.0 4.2 3.6  Chloride 98 - 111 mmol/L 110 105 106  CO2 22 - 32 mmol/L 25 21 24   Calcium 8.9 - 10.3 mg/dL 8.9 9.2 9.0  Total Protein 6.5 - 8.1 g/dL 7.6 7.6 -  Total Bilirubin 0.3 - 1.2 mg/dL 2.84) 0.3 -  Alkaline Phos 38 - 126 U/L 62 41 -  AST 15 - 41 U/L 37 16 -  ALT 0 - 44 U/L 76(H) 12 -   . Lab Results  Component Value Date   IRON 87 12/23/2020   TIBC 256 12/23/2020   IRONPCTSAT 34 12/23/2020   (Iron and TIBC)  Lab Results  Component Value Date   FERRITIN 274 12/23/2020    . Lab Results  Component Value Date   IRON 41 09/10/2020   TIBC 384 09/10/2020   IRONPCTSAT 11 (L) 09/10/2020   (Iron and TIBC)  Lab Results  Component Value Date   FERRITIN 7 (L) 09/10/2020     RADIOGRAPHIC STUDIES: I have personally reviewed the radiological images as listed and agreed with the findings in the report. No results found.  ASSESSMENT & PLAN:   30 yo with   1) Severe Iron deficiency with microcytic anemia related to heavy menstrual losses  PLAN: -Discussed patient's most recent labs from 12/23/2020; somewhat microcytic, Hgb improvedOther labs pending.  -Advised pt that 1.5 g of IV iron equated to 6 L of blood. Will continue to monitor her Iron levels from labs today and observe if need for IV iron again. Advised pt she may need one more IV Injectafer infusion. -Advised pt that assuming no blood loss, the Hgb increases 0.5 g every week. The normal person is able to bounce back in a few months.  -ferritin 274 with iron saturation of 34%-- no indication for additional IV Iron at this time. -Recommend pt increase the 150 mg Iron Polysaccharide from once daily to BID. -Recommended pt start a daily B-Complex vitamin. -Recommended pt continue to stay active. Monitor if there is functional decline or change.  -Advised pt to  continue to f/u w OBGYN regarding heavy menstrual bleeding. Advised pt to discuss goals and plan for controlling  this. -Advised pt that heart murmur while anemic is a functional murmur.  -Goal Ferritin >50, Goal Iron Sat >30 -Will see back in 4 months with labs.   FOLLOW UP: RTC with Dr Candise Che with labs in 4 months   All of the patients questions were answered with apparent satisfaction. The patient knows to call the clinic with any problems, questions or concerns.  The total time spent in the appointment was 20 minutes and more than 50% was on counseling and direct patient cares.    Wyvonnia Lora MD MS AAHIVMS [facility] Hematology/Oncology Physician [facility]  (Office):       [phone] (Work cell):  [phone] (Fax):           [phone]  12/23/2020 9:46 AM  I, Minda Meo, am acting as scribe for Dr. Wyvonnia Lora, MD.    .I have reviewed the above documentation for accuracy and completeness, and I agree with the above. Johney Maine MD

## 2020-12-23 ENCOUNTER — Inpatient Hospital Stay (HOSPITAL_BASED_OUTPATIENT_CLINIC_OR_DEPARTMENT_OTHER): Payer: 59 | Admitting: Hematology

## 2020-12-23 ENCOUNTER — Other Ambulatory Visit: Payer: Self-pay

## 2020-12-23 ENCOUNTER — Inpatient Hospital Stay: Payer: 59 | Attending: Hematology

## 2020-12-23 VITALS — BP 110/67 | HR 66 | Temp 96.3°F | Resp 18 | Ht 65.0 in | Wt 133.5 lb

## 2020-12-23 DIAGNOSIS — D500 Iron deficiency anemia secondary to blood loss (chronic): Secondary | ICD-10-CM | POA: Diagnosis not present

## 2020-12-23 DIAGNOSIS — R531 Weakness: Secondary | ICD-10-CM | POA: Diagnosis not present

## 2020-12-23 DIAGNOSIS — D509 Iron deficiency anemia, unspecified: Secondary | ICD-10-CM | POA: Insufficient documentation

## 2020-12-23 DIAGNOSIS — R010 Benign and innocent cardiac murmurs: Secondary | ICD-10-CM | POA: Insufficient documentation

## 2020-12-23 DIAGNOSIS — Z79899 Other long term (current) drug therapy: Secondary | ICD-10-CM | POA: Diagnosis not present

## 2020-12-23 LAB — CMP (CANCER CENTER ONLY)
ALT: 76 U/L — ABNORMAL HIGH (ref 0–44)
AST: 37 U/L (ref 15–41)
Albumin: 4.3 g/dL (ref 3.5–5.0)
Alkaline Phosphatase: 62 U/L (ref 38–126)
Anion gap: 5 (ref 5–15)
BUN: 12 mg/dL (ref 6–20)
CO2: 25 mmol/L (ref 22–32)
Calcium: 8.9 mg/dL (ref 8.9–10.3)
Chloride: 110 mmol/L (ref 98–111)
Creatinine: 0.81 mg/dL (ref 0.44–1.00)
GFR, Estimated: >60 mL/min (ref >60)
Glucose, Bld: 87 mg/dL (ref 70–99)
Potassium: 4 mmol/L (ref 3.5–5.1)
Sodium: 140 mmol/L (ref 135–145)
Total Bilirubin: <0.2 mg/dL — ABNORMAL LOW (ref 0.3–1.2)
Total Protein: 7.6 g/dL (ref 6.5–8.1)

## 2020-12-23 LAB — IRON AND TIBC
Iron: 87 ug/dL (ref 41–142)
Saturation Ratios: 34 % (ref 21–57)
TIBC: 256 ug/dL (ref 236–444)
UIBC: 169 ug/dL (ref 120–384)

## 2020-12-23 LAB — CBC WITH DIFFERENTIAL/PLATELET
Abs Immature Granulocytes: 0.03 10*3/uL (ref 0.00–0.07)
Basophils Absolute: 0.1 10*3/uL (ref 0.0–0.1)
Basophils Relative: 1 %
Eosinophils Absolute: 0.1 10*3/uL (ref 0.0–0.5)
Eosinophils Relative: 2 %
HCT: 38.7 % (ref 36.0–46.0)
Hemoglobin: 11.6 g/dL — ABNORMAL LOW (ref 12.0–15.0)
Immature Granulocytes: 1 %
Lymphocytes Relative: 32 %
Lymphs Abs: 2 10*3/uL (ref 0.7–4.0)
MCH: 22.7 pg — ABNORMAL LOW (ref 26.0–34.0)
MCHC: 30 g/dL (ref 30.0–36.0)
MCV: 75.9 fL — ABNORMAL LOW (ref 80.0–100.0)
Monocytes Absolute: 0.5 10*3/uL (ref 0.1–1.0)
Monocytes Relative: 8 %
Neutro Abs: 3.6 10*3/uL (ref 1.7–7.7)
Neutrophils Relative %: 56 %
Platelets: 261 10*3/uL (ref 150–400)
RBC: 5.1 MIL/uL (ref 3.87–5.11)
RDW: 15 % (ref 11.5–15.5)
WBC: 6.3 10*3/uL (ref 4.0–10.5)
nRBC: 0 % (ref 0.0–0.2)

## 2020-12-23 LAB — FERRITIN: Ferritin: 274 ng/mL (ref 11–307)

## 2021-02-15 ENCOUNTER — Encounter: Payer: Self-pay | Admitting: Internal Medicine

## 2021-02-16 ENCOUNTER — Encounter: Payer: 59 | Admitting: Internal Medicine

## 2021-03-04 ENCOUNTER — Telehealth: Payer: Self-pay | Admitting: Hematology

## 2021-03-04 NOTE — Telephone Encounter (Signed)
R/S per 02/16 los, Left message

## 2021-04-06 ENCOUNTER — Telehealth: Payer: Self-pay | Admitting: Hematology

## 2021-04-06 NOTE — Telephone Encounter (Signed)
R/s appts per 5/31 sch msg. Pt aware.  

## 2021-04-07 ENCOUNTER — Encounter: Payer: Self-pay | Admitting: Internal Medicine

## 2021-04-07 ENCOUNTER — Inpatient Hospital Stay: Payer: 59

## 2021-04-07 ENCOUNTER — Inpatient Hospital Stay: Payer: 59 | Admitting: Hematology

## 2021-04-21 ENCOUNTER — Ambulatory Visit: Payer: 59 | Admitting: Hematology

## 2021-04-21 ENCOUNTER — Other Ambulatory Visit: Payer: 59

## 2021-05-19 ENCOUNTER — Other Ambulatory Visit: Payer: Self-pay

## 2021-05-19 ENCOUNTER — Ambulatory Visit (INDEPENDENT_AMBULATORY_CARE_PROVIDER_SITE_OTHER): Payer: 59 | Admitting: Internal Medicine

## 2021-05-19 ENCOUNTER — Encounter: Payer: Self-pay | Admitting: Internal Medicine

## 2021-05-19 VITALS — BP 114/66 | HR 86 | Temp 98.6°F | Ht 65.0 in | Wt 137.2 lb

## 2021-05-19 DIAGNOSIS — R011 Cardiac murmur, unspecified: Secondary | ICD-10-CM

## 2021-05-19 DIAGNOSIS — R0789 Other chest pain: Secondary | ICD-10-CM | POA: Diagnosis not present

## 2021-05-19 DIAGNOSIS — Z0000 Encounter for general adult medical examination without abnormal findings: Secondary | ICD-10-CM | POA: Diagnosis not present

## 2021-05-19 DIAGNOSIS — H9203 Otalgia, bilateral: Secondary | ICD-10-CM | POA: Diagnosis not present

## 2021-05-19 DIAGNOSIS — D500 Iron deficiency anemia secondary to blood loss (chronic): Secondary | ICD-10-CM

## 2021-05-19 NOTE — Patient Instructions (Addendum)
Health Maintenance, Female Adopting a healthy lifestyle and getting preventive care are important in promoting health and wellness. Ask your health care provider about: The right schedule for you to have regular tests and exams. Things you can do on your own to prevent diseases and keep yourself healthy. What should I know about diet, weight, and exercise? Eat a healthy diet  Eat a diet that includes plenty of vegetables, fruits, low-fat dairy products, and lean protein. Do not eat a lot of foods that are high in solid fats, added sugars, or sodium.  Maintain a healthy weight Body mass index (BMI) is used to identify weight problems. It estimates body fat based on height and weight. Your health care provider can help determineyour BMI and help you achieve or maintain a healthy weight. Get regular exercise Get regular exercise. This is one of the most important things you can do for your health. Most adults should: Exercise for at least 150 minutes each week. The exercise should increase your heart rate and make you sweat (moderate-intensity exercise). Do strengthening exercises at least twice a week. This is in addition to the moderate-intensity exercise. Spend less time sitting. Even light physical activity can be beneficial. Watch cholesterol and blood lipids Have your blood tested for lipids and cholesterol at 28 years of age, then havethis test every 5 years. Have your cholesterol levels checked more often if: Your lipid or cholesterol levels are high. You are older than 28 years of age. You are at high risk for heart disease. What should I know about cancer screening? Depending on your health history and family history, you may need to have cancer screening at various ages. This may include screening for: Breast cancer. Cervical cancer. Colorectal cancer. Skin cancer. Lung cancer. What should I know about heart disease, diabetes, and high blood pressure? Blood pressure and heart  disease High blood pressure causes heart disease and increases the risk of stroke. This is more likely to develop in people who have high blood pressure readings, are of African descent, or are overweight. Have your blood pressure checked: Every 3-5 years if you are 18-39 years of age. Every year if you are 40 years old or older. Diabetes Have regular diabetes screenings. This checks your fasting blood sugar level. Have the screening done: Once every three years after age 40 if you are at a normal weight and have a low risk for diabetes. More often and at a younger age if you are overweight or have a high risk for diabetes. What should I know about preventing infection? Hepatitis B If you have a higher risk for hepatitis B, you should be screened for this virus. Talk with your health care provider to find out if you are at risk forhepatitis B infection. Hepatitis C Testing is recommended for: Everyone born from 1945 through 1965. Anyone with known risk factors for hepatitis C. Sexually transmitted infections (STIs) Get screened for STIs, including gonorrhea and chlamydia, if: You are sexually active and are younger than 28 years of age. You are older than 28 years of age and your health care provider tells you that you are at risk for this type of infection. Your sexual activity has changed since you were last screened, and you are at increased risk for chlamydia or gonorrhea. Ask your health care provider if you are at risk. Ask your health care provider about whether you are at high risk for HIV. Your health care provider may recommend a prescription medicine to help   prevent HIV infection. If you choose to take medicine to prevent HIV, you should first get tested for HIV. You should then be tested every 3 months for as long as you are taking the medicine. Pregnancy If you are about to stop having your period (premenopausal) and you may become pregnant, seek counseling before you get  pregnant. Take 400 to 800 micrograms (mcg) of folic acid every day if you become pregnant. Ask for birth control (contraception) if you want to prevent pregnancy. Osteoporosis and menopause Osteoporosis is a disease in which the bones lose minerals and strength with aging. This can result in bone fractures. If you are 65 years old or older, or if you are at risk for osteoporosis and fractures, ask your health care provider if you should: Be screened for bone loss. Take a calcium or vitamin D supplement to lower your risk of fractures. Be given hormone replacement therapy (HRT) to treat symptoms of menopause. Follow these instructions at home: Lifestyle Do not use any products that contain nicotine or tobacco, such as cigarettes, e-cigarettes, and chewing tobacco. If you need help quitting, ask your health care provider. Do not use street drugs. Do not share needles. Ask your health care provider for help if you need support or information about quitting drugs. Alcohol use Do not drink alcohol if: Your health care provider tells you not to drink. You are pregnant, may be pregnant, or are planning to become pregnant. If you drink alcohol: Limit how much you use to 0-1 drink a day. Limit intake if you are breastfeeding. Be aware of how much alcohol is in your drink. In the [place] one drink equals one 12 oz bottle of beer (355 mL), one 5 oz glass of wine (148 mL), or one 1 oz glass of hard liquor (44 mL). General instructions Schedule regular health, dental, and eye exams. Stay current with your vaccines. Tell your health care provider if: You often feel depressed. You have ever been abused or do not feel safe at home. Summary Adopting a healthy lifestyle and getting preventive care are important in promoting health and wellness. Follow your health care provider's instructions about healthy diet, exercising, and getting tested or screened for diseases. Follow your health care provider's  instructions on monitoring your cholesterol and blood pressure. This information is not intended to replace advice given to you by your health care provider. Make sure you discuss any questions you have with your healthcare provider. Document Revised: 10/17/2018 Document Reviewed: 10/17/2018 Elsevier Patient Education  2022 [employer]  

## 2021-05-19 NOTE — Progress Notes (Signed)
I,Katawbba Wiggins,acting as a Education administrator for Maximino Greenland, MD.,have documented all relevant documentation on the behalf of Maximino Greenland, MD,as directed by  Maximino Greenland, MD while in the presence of Maximino Greenland, MD.  This visit occurred during the SARS-CoV-2 public health emergency.  Safety protocols were in place, including screening questions prior to the visit, additional usage of staff PPE, and extensive cleaning of exam room while observing appropriate contact time as indicated for disinfecting solutions.  Subjective:     Patient ID: Linda Yates , female    DOB: [birth year] , 30 y.o.   MRN: [id number]   Chief Complaint  Patient presents with   Annual Exam    HPI  She is here today for a full physical examination. She is now followed by Dr. Sabra Heck for her GYN exams. Her last exam was July 2021.      Past Medical History:  Diagnosis Date   Allergy    Anemia      Family History  Problem Relation Age of Onset   Hyperthyroidism Mother    Autoimmune disease Maternal Grandmother        antiphospholipid ab syndrome   Hypertension Maternal Grandmother    Diabetes Maternal Grandmother    Congestive Heart Failure Maternal Grandmother    Kidney disease Maternal Grandmother    Stroke Maternal Grandmother    Hyperlipidemia Maternal Grandmother    Hyperthyroidism Maternal Grandmother      Current Outpatient Medications:    iron polysaccharides (NIFEREX) 150 MG capsule, Take 1 capsule (150 mg total) by mouth daily., Disp: 30 capsule, Rfl: 5   MAGNESIUM PO, Take by mouth., Disp: , Rfl:    neomycin-colistin-hydrocortisone-thonzonium (CORTISPORIN-TC) 3.01-07-09-0.5 MG/ML OTIC suspension, Place 3 drops into both ears 4 (four) times daily as needed., Disp: 10 mL, Rfl: 0   tranexamic acid (LYSTEDA) 650 MG TABS tablet, Take 2 tablets (1,300 mg total) by mouth 3 (three) times daily. Stop when bleeding starts to improved.  Only take for up to 5 days each cycle., Disp: 30 tablet, Rfl:  10   VITAMIN D PO, Take by mouth., Disp: , Rfl:    metroNIDAZOLE (FLAGYL) 500 MG tablet, Take 1 tablet (500 mg total) by mouth 2 (two) times daily., Disp: 14 tablet, Rfl: 0   naproxen (NAPROSYN) 500 MG tablet, Take 1 tablet (500 mg total) by mouth 2 (two) times daily., Disp: 30 tablet, Rfl: 0   Allergies  Allergen Reactions   Prednisone     Pt stated, "I get insomnia with this medication" Other reaction(s): Unknown      The patient states she uses none for birth control. Last LMP was Patient's last menstrual period was 05/11/2021.. Negative for Dysmenorrhea. Negative for: breast discharge, breast lump(s), breast pain and breast self exam. Associated symptoms include abnormal vaginal bleeding. Pertinent negatives include abnormal bleeding (hematology), anxiety, decreased libido, depression, difficulty falling sleep, dyspareunia, history of infertility, nocturia, sexual dysfunction, sleep disturbances, urinary incontinence, urinary urgency, vaginal discharge and vaginal itching. Diet regular.The patient states her exercise level is  intermittent.  . The patient's tobacco use is:  Social History   Tobacco Use  Smoking Status Never  Smokeless Tobacco Never  . She has been exposed to passive smoke. The patient's alcohol use is:  Social History   Substance and Sexual Activity  Alcohol Use Never   Alcohol/week: 0.0 standard drinks    Review of Systems  Constitutional: Negative.   HENT: Negative.  She c/o itchy ears. States she sometimes picks her ears. She has used Cortisporin in the past w/ resolution of her sx.   Eyes: Negative.   Respiratory: Negative.    Cardiovascular:  Positive for chest pain.       She states she is still having chest pain. Initially discussed w/ GYN - due to murmur - she was referred to Cardiology. However, due to scheduling conflicts she was unable to keep the appt. She reports her sx are intermittent. She is unable to identify triggers other than stress.  She admits to having a lot of stress. She is currently working 3 jobs. Additionally, she had a breakup of a long-term relationship in Winter 2021 which was quite stressful.  Described as chest heaviness. No associated palpitations. There is associated SOB.    Gastrointestinal: Negative.   Endocrine: Negative.   Genitourinary: Negative.        She c/o heavy cycles. She reports having accidents on occasion. She usually uses maxipads. Has not tried tampons yet.   Musculoskeletal: Negative.   Skin: Negative.   Allergic/Immunologic: Negative.   Neurological: Negative.   Hematological: Negative.   Psychiatric/Behavioral: Negative.      Today's Vitals   05/19/21 1551  BP: 114/66  Pulse: 86  Temp: 98.6 F (37 C)  TempSrc: Oral  Weight: 137 lb 3.2 oz (62.2 kg)  Height: 5' 5"  (1.651 m)   Body mass index is 22.83 kg/m.  Wt Readings from Last 3 Encounters:  05/19/21 137 lb 3.2 oz (62.2 kg)  12/23/20 133 lb 8 oz (60.6 kg)  10/16/20 134 lb 8 oz (61 kg)    BP Readings from Last 3 Encounters:  05/27/21 121/79  05/19/21 114/66  12/23/20 110/67    Objective:  Physical Exam Vitals and nursing note reviewed.  Constitutional:      Appearance: Normal appearance.  HENT:     Head: Normocephalic and atraumatic.     Right Ear: Tympanic membrane and external ear normal. There is no impacted cerumen.     Left Ear: Tympanic membrane and external ear normal. There is no impacted cerumen.     Ears:     Comments: Scaly skin in ear canal b/l    Nose:     Comments: Masked     Mouth/Throat:     Comments: Masked  Eyes:     Extraocular Movements: Extraocular movements intact.     Conjunctiva/sclera: Conjunctivae normal.     Pupils: Pupils are equal, round, and reactive to light.  Cardiovascular:     Rate and Rhythm: Normal rate and regular rhythm.     Pulses: Normal pulses.     Heart sounds: Normal heart sounds.  Pulmonary:     Effort: Pulmonary effort is normal.     Breath sounds: Normal  breath sounds.  Abdominal:     General: Abdomen is flat. Bowel sounds are normal.     Palpations: Abdomen is soft.  Genitourinary:    Comments: deferred Musculoskeletal:        General: Normal range of motion.     Cervical back: Normal range of motion and neck supple.  Skin:    General: Skin is warm and dry.  Neurological:     General: No focal deficit present.     Mental Status: She is alert and oriented to person, place, and time.  Psychiatric:        Mood and Affect: Mood normal.        Behavior: Behavior normal.  Assessment And Plan:     1. Routine general medical examination at health care facility Comments: A full exam was performed. Importance of monthly self breast exams was discussed with the patient. PATIENT IS ADVISED TO GET 30-45 MINUTES REGULAR EXERCISE NO LESS THAN FOUR TO FIVE DAYS PER WEEK - BOTH WEIGHTBEARING EXERCISES AND AEROBIC ARE RECOMMENDED.  PATIENT IS ADVISED TO FOLLOW A HEALTHY DIET WITH AT LEAST SIX FRUITS/VEGGIES PER DAY, DECREASE INTAKE OF RED MEAT, AND TO INCREASE FISH INTAKE TO TWO DAYS PER WEEK.  MEATS/FISH SHOULD NOT BE FRIED, BAKED OR BROILED IS PREFERABLE.  IT IS ALSO IMPORTANT TO CUT BACK ON YOUR SUGAR INTAKE. PLEASE AVOID ANYTHING WITH ADDED SUGAR, CORN SYRUP OR OTHER SWEETENERS. IF YOU MUST USE A SWEETENER, YOU CAN TRY STEVIA. IT IS ALSO IMPORTANT TO AVOID ARTIFICIALLY SWEETENERS AND DIET BEVERAGES. LASTLY, I SUGGEST WEARING SPF 50 SUNSCREEN ON EXPOSED PARTS AND ESPECIALLY WHEN IN THE DIRECT SUNLIGHT FOR AN EXTENDED PERIOD OF TIME.  PLEASE AVOID FAST FOOD RESTAURANTS AND INCREASE YOUR WATER INTAKE.  - Hepatitis C antibody - CBC - CMP14+EGFR - Lipid panel  2. Atypical chest pain Comments: Unfortunately, she never had Cardiology evaluation. Referral placed again. Possibly related to anemia/stress.  - EKG 12-Lead - Ambulatory referral to Cardiology  3. Iron deficiency anemia due to chronic blood loss Comments: I will check iron panel and  supplement as needed.  - CBC - Iron, TIBC and Ferritin Panel - Vitamin B12  4. Ear discomfort, bilateral Comments: I will refill Cortisporin as requested.   Patient was given opportunity to ask questions. Patient verbalized understanding of the plan and was able to repeat key elements of the plan. All questions were answered to their satisfaction.   I, Maximino Greenland, MD, have reviewed all documentation for this visit. The documentation on 05/30/21 for the exam, diagnosis, procedures, and orders are all accurate and complete.  THE PATIENT IS ENCOURAGED TO PRACTICE SOCIAL DISTANCING DUE TO THE COVID-19 PANDEMIC.

## 2021-05-20 LAB — CMP14+EGFR
ALT: 19 IU/L (ref 0–32)
AST: 19 IU/L (ref 0–40)
Albumin/Globulin Ratio: 1.5 (ref 1.2–2.2)
Albumin: 4.7 g/dL (ref 3.9–5.0)
Alkaline Phosphatase: 48 IU/L (ref 44–121)
BUN/Creatinine Ratio: 13 (ref 9–23)
BUN: 14 mg/dL (ref 6–20)
Bilirubin Total: <0.2 mg/dL (ref 0.0–1.2)
CO2: 22 mmol/L (ref 20–29)
Calcium: 9.3 mg/dL (ref 8.7–10.2)
Chloride: 102 mmol/L (ref 96–106)
Creatinine, Ser: 1.08 mg/dL — ABNORMAL HIGH (ref 0.57–1.00)
Globulin, Total: 3.2 g/dL (ref 1.5–4.5)
Glucose: 79 mg/dL (ref 65–99)
Potassium: 4.3 mmol/L (ref 3.5–5.2)
Sodium: 139 mmol/L (ref 134–144)
Total Protein: 7.9 g/dL (ref 6.0–8.5)
eGFR: 72 mL/min/{1.73_m2} (ref >59)

## 2021-05-20 LAB — LIPID PANEL
Chol/HDL Ratio: 2.4 ratio (ref 0.0–4.4)
Cholesterol, Total: 171 mg/dL (ref 100–199)
HDL: 71 mg/dL (ref >39)
LDL Chol Calc (NIH): 94 mg/dL (ref 0–99)
Triglycerides: 24 mg/dL (ref 0–149)
VLDL Cholesterol Cal: 6 mg/dL (ref 5–40)

## 2021-05-20 LAB — IRON,TIBC AND FERRITIN PANEL
Ferritin: 313 ng/mL — ABNORMAL HIGH (ref 15–150)
Iron Saturation: 29 % (ref 15–55)
Iron: 75 ug/dL (ref 27–159)
Total Iron Binding Capacity: 262 ug/dL (ref 250–450)
UIBC: 187 ug/dL (ref 131–425)

## 2021-05-20 LAB — CBC
Hematocrit: 37.4 % (ref 34.0–46.6)
Hemoglobin: 11.6 g/dL (ref 11.1–15.9)
MCH: 22.7 pg — ABNORMAL LOW (ref 26.6–33.0)
MCHC: 31 g/dL — ABNORMAL LOW (ref 31.5–35.7)
MCV: 73 fL — ABNORMAL LOW (ref 79–97)
Platelets: 295 10*3/uL (ref 150–450)
RBC: 5.1 x10E6/uL (ref 3.77–5.28)
RDW: 12.5 % (ref 11.7–15.4)
WBC: 6.9 10*3/uL (ref 3.4–10.8)

## 2021-05-20 LAB — VITAMIN B12: Vitamin B-12: 994 pg/mL (ref 232–1245)

## 2021-05-20 LAB — HEPATITIS C ANTIBODY: Hep C Virus Ab: <0.1 s/co ratio (ref 0.0–0.9)

## 2021-05-27 ENCOUNTER — Inpatient Hospital Stay: Payer: 59

## 2021-05-27 ENCOUNTER — Ambulatory Visit
Admission: RE | Admit: 2021-05-27 | Discharge: 2021-05-27 | Disposition: A | Discharge status: Home / Self Care | Payer: 59 | Source: Ambulatory Visit | Attending: Emergency Medicine | Admitting: Emergency Medicine

## 2021-05-27 ENCOUNTER — Ambulatory Visit: Payer: 59 | Admitting: Nurse Practitioner

## 2021-05-27 ENCOUNTER — Inpatient Hospital Stay: Payer: 59 | Admitting: Hematology

## 2021-05-27 ENCOUNTER — Other Ambulatory Visit: Payer: Self-pay

## 2021-05-27 VITALS — BP 121/79 | HR 78 | Temp 98.8°F | Resp 18

## 2021-05-27 DIAGNOSIS — N898 Other specified noninflammatory disorders of vagina: Secondary | ICD-10-CM | POA: Insufficient documentation

## 2021-05-27 DIAGNOSIS — R350 Frequency of micturition: Secondary | ICD-10-CM | POA: Insufficient documentation

## 2021-05-27 DIAGNOSIS — R1030 Lower abdominal pain, unspecified: Secondary | ICD-10-CM | POA: Insufficient documentation

## 2021-05-27 LAB — POCT URINALYSIS DIP (MANUAL ENTRY)
Bilirubin, UA: NEGATIVE
Blood, UA: NEGATIVE
Glucose, UA: NEGATIVE mg/dL
Ketones, POC UA: NEGATIVE mg/dL
Leukocytes, UA: NEGATIVE
Nitrite, UA: NEGATIVE
Protein Ur, POC: NEGATIVE mg/dL
Spec Grav, UA: 1.02 (ref 1.010–1.025)
Urobilinogen, UA: 0.2 E.U./dL
pH, UA: 7.5 (ref 5.0–8.0)

## 2021-05-27 LAB — POCT URINE PREGNANCY: Preg Test, Ur: NEGATIVE

## 2021-05-27 MED ORDER — NAPROXEN 500 MG PO TABS: 500.0000 mg | ORAL_TABLET | Freq: Two times a day (BID) | ORAL | 0 refills | Status: DC

## 2021-05-27 NOTE — ED Provider Notes (Signed)
[facility]    CSN: [id number] Arrival date & time: 05/27/21  1245      History   Chief Complaint Chief Complaint  Patient presents with   Appointment    1315   Dysuria    HPI Linda Yates is a 31 y.o. female presenting today for evaluation of abdominal cramping and urinary frequency.  Reports over the past couple days she has had increased discomfort to her lower abdomen described as a cramping sensation.  Has had slight discharge as well as urinary frequency and urgency.  Denies dysuria.  Denies itching or irritation.  Has had 1 prior UTI.  Denies history of yeast/BV.  Declines being sexually active.  Declines change in soaps.  HPI  Past Medical History:  Diagnosis Date   Allergy    Anemia     Patient Active Problem List   Diagnosis Date Noted   Systolic murmur 04/29/2020   Iron deficiency anemia due to chronic blood loss 04/29/2020   Allergic rhinitis due to pollen 02/10/2015    History reviewed. No pertinent surgical history.  OB History     Gravida  0   Para  0   Term  0   Preterm  0   AB  0   Living  0      SAB  0   IAB  0   Ectopic  0   Multiple  0   Live Births  0            Home Medications    Prior to Admission medications   Medication Sig Start Date End Date Taking? Authorizing Provider  naproxen (NAPROSYN) 500 MG tablet Take 1 tablet (500 mg total) by mouth 2 (two) times daily. 05/27/21  Yes Taralynn Quiett C, PA-C  iron polysaccharides (NIFEREX) 150 MG capsule Take 1 capsule (150 mg total) by mouth daily. 09/22/20   Johney Maine, MD  MAGNESIUM PO Take by mouth.    [provider]  tranexamic acid (LYSTEDA) 650 MG TABS tablet Take 2 tablets (1,300 mg total) by mouth 3 (three) times daily. Stop when bleeding starts to improved.  Only take for up to 5 days each cycle. 07/29/20   Jerene Bears, MD  VITAMIN D PO Take by mouth.    [provider]    Family History Family History  Problem  Relation Age of Onset   Hyperthyroidism Mother    Autoimmune disease Maternal Grandmother        antiphospholipid ab syndrome   Hypertension Maternal Grandmother    Diabetes Maternal Grandmother    Congestive Heart Failure Maternal Grandmother    Kidney disease Maternal Grandmother    Stroke Maternal Grandmother    Hyperlipidemia Maternal Grandmother    Hyperthyroidism Maternal Grandmother     Social History Social History   Tobacco Use   Smoking status: Never   Smokeless tobacco: Never  Substance Use Topics   Alcohol use: Never    Alcohol/week: 0.0 standard drinks   Drug use: Never     Allergies   Prednisone   Review of Systems Review of Systems  Constitutional:  Negative for fever.  Respiratory:  Negative for shortness of breath.   Cardiovascular:  Negative for chest pain.  Gastrointestinal:  Positive for abdominal pain. Negative for diarrhea, nausea and vomiting.  Genitourinary:  Positive for vaginal discharge. Negative for dysuria, flank pain, genital sores, hematuria, menstrual problem, vaginal bleeding and vaginal pain.  Musculoskeletal:  Negative for back pain.  Skin:  Negative for rash.  Neurological:  Negative for dizziness, light-headedness and headaches.    Physical Exam Triage Vital Signs ED Triage Vitals [05/27/21 1316]  Enc Vitals Group     BP 121/79     Pulse Rate 78     Resp 18     Temp 98.8 F (37.1 C)     Temp Source Oral     SpO2 97 %     Weight      Height      Head Circumference      Peak Flow      Pain Score 3     Pain Loc      Pain Edu?      Excl. in GC?    No data found.  Updated Vital Signs BP 121/79 (BP Location: Left Arm)   Pulse 78   Temp 98.8 F (37.1 C) (Oral)   Resp 18   LMP 05/11/2021   SpO2 97%   Visual Acuity Right Eye Distance:   Left Eye Distance:   Bilateral Distance:    Right Eye Near:   Left Eye Near:    Bilateral Near:     Physical Exam Vitals and nursing note reviewed.  Constitutional:       Appearance: She is well-developed.     Comments: No acute distress  HENT:     Head: Normocephalic and atraumatic.     Nose: Nose normal.  Eyes:     Conjunctiva/sclera: Conjunctivae normal.  Cardiovascular:     Rate and Rhythm: Normal rate.  Pulmonary:     Effort: Pulmonary effort is normal. No respiratory distress.  Abdominal:     General: There is no distension.  Musculoskeletal:        General: Normal range of motion.     Cervical back: Neck supple.  Skin:    General: Skin is warm and dry.  Neurological:     Mental Status: She is alert and oriented to person, place, and time.     UC Treatments / Results  Labs (all labs ordered are listed, but only abnormal results are displayed) Labs Reviewed  POCT URINE PREGNANCY  POCT URINALYSIS DIP (MANUAL ENTRY)  CERVICOVAGINAL ANCILLARY ONLY    EKG   Radiology No results found.  Procedures Procedures (including critical care time)  Medications Ordered in UC Medications - No data to display  Initial Impression / Assessment and Plan / UC Course  I have reviewed the triage vital signs and the nursing notes.  Pertinent labs & imaging results that were available during my care of the patient were reviewed by me and considered in my medical decision making (see chart for details).     UA unremarkable, vaginal swab pending to screen for any vaginal infections, recommend Naprosyn in the interim, possible suspicion of BV as cause of symptoms mimicking UTI.  Push fluids.  Discussed strict return precautions. Patient verbalized understanding and is agreeable with plan.  Final Clinical Impressions(s) / UC Diagnoses   Final diagnoses:  Lower abdominal pain  Urinary frequency  Vaginal discharge     Discharge Instructions      Urine not suggestive of UTI, vaginal swab pending to screen for any vaginal infections We will call with results and provide further medicine as needed In the meantime use Naprosyn twice daily for  pain/cramping, warm compresses to lower abdomen Follow-up if not improving or worsening     ED Prescriptions     Medication Sig Dispense Auth. Provider  naproxen (NAPROSYN) 500 MG tablet Take 1 tablet (500 mg total) by mouth 2 (two) times daily. 30 tablet Unika Nazareno, Jasper C, PA-C      PDMP not reviewed this encounter.   Lew Dawes, [place] 05/27/21 1419

## 2021-05-27 NOTE — Discharge Instructions (Addendum)
Urine not suggestive of UTI, vaginal swab pending to screen for any vaginal infections We will call with results and provide further medicine as needed In the meantime use Naprosyn twice daily for pain/cramping, warm compresses to lower abdomen Follow-up if not improving or worsening

## 2021-05-27 NOTE — ED Triage Notes (Signed)
Pt here for dysuria x 3 days  

## 2021-05-28 ENCOUNTER — Telehealth: Payer: Self-pay

## 2021-05-28 LAB — CERVICOVAGINAL ANCILLARY ONLY
Bacterial Vaginitis (gardnerella): POSITIVE — AB
Candida Glabrata: NEGATIVE
Candida Vaginitis: NEGATIVE
Chlamydia: NEGATIVE
Comment: NEGATIVE
Comment: NEGATIVE
Comment: NEGATIVE
Comment: NEGATIVE
Comment: NEGATIVE
Comment: NORMAL
Neisseria Gonorrhea: NEGATIVE
Trichomonas: NEGATIVE

## 2021-05-28 MED ORDER — METRONIDAZOLE 500 MG PO TABS: 500.0000 mg | ORAL_TABLET | Freq: Two times a day (BID) | ORAL | 0 refills | Status: DC

## 2021-05-30 DIAGNOSIS — R0789 Other chest pain: Secondary | ICD-10-CM | POA: Insufficient documentation

## 2021-05-30 MED ORDER — CORTISPORIN-TC 3.3-3-10-0.5 MG/ML OT SUSP: 3.0000 [drp] | Freq: Four times a day (QID) | OTIC | 0 refills | Status: DC | PRN

## 2021-06-08 NOTE — Progress Notes (Signed)
HEMATOLOGY/ONCOLOGY CONSULTATION NOTE  Date of Service: 06/08/2021  Patient Care Team: Dorothyann Peng, MD as PCP - General (Internal Medicine)  CHIEF COMPLAINTS/PURPOSE OF CONSULTATION:  IDA  HISTORY OF PRESENTING ILLNESS:   Linda Yates is a wonderful 30 y.o. female who has been referred to [place] by Dr. Hyacinth Meeker for evaluation and management of iron deficiency anemia. The pt reports that she is doing well overall.   The pt reports that she was first told that she was anemic a few years ago. Over the last few years she has noticed heavier menstrual cycles with heavy clotting. She was recently found to have small uterine fibroids and was placed on Lysteda for a few months. Her menstrual cycles were shorter with Lysteda, but no other changes were observed. Pt has not tried any forms of birth control or IUDs. She is interested in having children soon, but is not actively trying for a child. Pt has tried OTC multivitamins with iron and other forms of OTC iron but experienced nausea and diarrhea. She eats a fairly balanced diet and denies any gluten intolerance. She denies any other medical problems or known drug allergies. Pt had a wisdom tooth extraction and had no excessive bleeding. She is taking OTC Magnesium to prevent leg cramps.   Most recent lab results (09/10/2020) of CBC is as follows: all values are WNL except for Hgb at 9.5, HCT at 32.1, MCV at 72, MCH at 21.4, MCHC at 29.6. 09/10/2020 Iron, TIBC and Ferritin Panel shows: TIBC at 384, UIBC at 343, Iron at 41, Iron Sat at 11, Ferritin at 7.  On review of systems, pt reports muscle cramps, heavy menstrual cycles and denies bruising, hematuria, bloody stools, nose bleeds, gum bleeds, abdominal pain, leg swelling and any other symptoms.   On PMHx the pt reports Anemia, Wisdom Tooth Extraction. On Social Hx the pt reports that she is a non-smoker and does not drink much alcohol outside of social situations.  On Family Hx the pt reports  that her maternal grandmother had antiphospholipid antibodies and suffered from strokes.   INTERVAL HISTORY  Linda Yates is a wonderful 30 y.o. female who is here today for evaluation and management of iron deficiency anemia. The patient's last visit with [place] was on 12/23/2020. The pt reports that she is doing well overall.  The pt reports good energy levels no acute new sypmtoms. Had Bacterial vaginosis -rxed with flagyl Lysteda -improved her menorrhagia Dr Hyacinth Meeker Obgyn  Lab results today 06/09/2021 of CBC w/diff pending.  Cmp wnl Ferritin 217 with iron saturation of 36% B12 wnl  On review of systems, pt reports no other acute symptoms.   MEDICAL HISTORY:  Past Medical History:  Diagnosis Date   Allergy    Anemia     SURGICAL HISTORY: No past surgical history on file.  SOCIAL HISTORY: Social History   Socioeconomic History   Marital status: Single    Spouse name: Not on file   Number of children: Not on file   Years of education: Not on file   Highest education level: Not on file  Occupational History   Not on file  Tobacco Use   Smoking status: Never   Smokeless tobacco: Never  Substance and Sexual Activity   Alcohol use: Never    Alcohol/week: 0.0 standard drinks   Drug use: Never   Sexual activity: Never  Other Topics Concern   Not on file  Social History Narrative   Not on file  Social Determinants of Health   Financial Resource Strain: Not on file  Food Insecurity: Not on file  Transportation Needs: Not on file  Physical Activity: Not on file  Stress: Not on file  Social Connections: Not on file  Intimate Partner Violence: Not on file    FAMILY HISTORY: Family History  Problem Relation Age of Onset   Hyperthyroidism Mother    Autoimmune disease Maternal Grandmother        antiphospholipid ab syndrome   Hypertension Maternal Grandmother    Diabetes Maternal Grandmother    Congestive Heart Failure Maternal Grandmother    Kidney disease  Maternal Grandmother    Stroke Maternal Grandmother    Hyperlipidemia Maternal Grandmother    Hyperthyroidism Maternal Grandmother     ALLERGIES:  is allergic to prednisone.  MEDICATIONS:  Current Outpatient Medications  Medication Sig Dispense Refill   iron polysaccharides (NIFEREX) 150 MG capsule Take 1 capsule (150 mg total) by mouth daily. 30 capsule 5   MAGNESIUM PO Take by mouth.     metroNIDAZOLE (FLAGYL) 500 MG tablet Take 1 tablet (500 mg total) by mouth 2 (two) times daily. 14 tablet 0   naproxen (NAPROSYN) 500 MG tablet Take 1 tablet (500 mg total) by mouth 2 (two) times daily. 30 tablet 0   neomycin-colistin-hydrocortisone-thonzonium (CORTISPORIN-TC) 3.01-07-09-0.5 MG/ML OTIC suspension Place 3 drops into both ears 4 (four) times daily as needed. 10 mL 0   tranexamic acid (LYSTEDA) 650 MG TABS tablet Take 2 tablets (1,300 mg total) by mouth 3 (three) times daily. Stop when bleeding starts to improved.  Only take for up to 5 days each cycle. 30 tablet 10   VITAMIN D PO Take by mouth.     No current facility-administered medications for this visit.    REVIEW OF SYSTEMS:   10 Point review of Systems was done is negative except as noted above.  PHYSICAL EXAMINATION: ECOG PERFORMANCE STATUS: 1 - Symptomatic but completely ambulatory  . Vitals:   06/09/21 0920  BP: 112/75  Pulse: 80  Resp: 16  Temp: 98.3 F (36.8 C)  SpO2: 100%    Filed Weights   06/09/21 0920  Weight: 141 lb 9.6 oz (64.2 kg)    .Body mass index is 23.56 kg/m.   NAD GENERAL:alert, in no acute distress and comfortable SKIN: no acute rashes, no significant lesions EYES: conjunctiva are pink and non-injected, sclera anicteric OROPHARYNX: MMM, no exudates, no oropharyngeal erythema or ulceration NECK: supple, no JVD LYMPH:  no palpable lymphadenopathy in the cervical, axillary or inguinal regions LUNGS: clear to auscultation b/l with normal respiratory effort HEART: regular rate &  rhythm ABDOMEN:  normoactive bowel sounds , non tender, not distended. Extremity: no pedal edema PSYCH: alert & oriented x 3 with fluent speech NEURO: no focal motor/sensory deficits  LABORATORY DATA:  I have reviewed the data as listed  . CBC Latest Ref Rng & Units 05/19/2021 12/23/2020 09/10/2020  WBC 3.4 - 10.8 x10E3/uL 6.9 6.3 5.5  Hemoglobin 11.1 - 15.9 g/dL 68.0 11.6(L) 9.5(L)  Hematocrit 34.0 - 46.6 % 37.4 38.7 32.1(L)  Platelets 150 - 450 x10E3/uL 295 261 359   . CBC    Component Value Date/Time   WBC 6.9 05/19/2021 1705   WBC 6.3 12/23/2020 0859   RBC 5.10 05/19/2021 1705   RBC 5.10 12/23/2020 0859   HGB 11.6 05/19/2021 1705   HCT 37.4 05/19/2021 1705   PLT 295 05/19/2021 1705   MCV 73 (L) 05/19/2021 1705   MCH  22.7 (L) 05/19/2021 1705   MCH 22.7 (L) 12/23/2020 0859   MCHC 31.0 (L) 05/19/2021 1705   MCHC 30.0 12/23/2020 0859   RDW 12.5 05/19/2021 1705   LYMPHSABS 2.0 12/23/2020 0859   MONOABS 0.5 12/23/2020 0859   EOSABS 0.1 12/23/2020 0859   BASOSABS 0.1 12/23/2020 0859    . CMP Latest Ref Rng & Units 06/09/2021 05/19/2021 12/23/2020  Glucose 70 - 99 mg/dL 84 79 87  BUN 6 - 20 mg/dL 11 14 12   Creatinine 0.44 - 1.00 mg/dL 3.79) 0.24(O  Sodium 135 - 145 mmol/L 139 139 140  Potassium 3.5 - 5.1 mmol/L 3.8 4.3 4.0  Chloride 98 - 111 mmol/L 110 102 110  CO2 22 - 32 mmol/L 22 22 25   Calcium 8.9 - 10.3 mg/dL 8.9 9.3 8.9  Total Protein 6.5 - 8.1 g/dL 7.4 7.9 7.6  Total Bilirubin 0.3 - 1.2 mg/dL 0.3 9.73 )  Alkaline Phos 38 - 126 U/L 32(L) 48 62  AST 15 - 41 U/L 17 19 37  ALT 0 - 44 U/L 20 19 76(H)   . Lab Results  Component Value Date   IRON 87 06/09/2021   TIBC 245 06/09/2021   IRONPCTSAT 36 06/09/2021   (Iron and TIBC)  Lab Results  Component Value Date   FERRITIN 217 06/09/2021      RADIOGRAPHIC STUDIES: I have personally reviewed the radiological images as listed and agreed with the findings in the report. No results  found.  ASSESSMENT & PLAN:   30 yo with   1) s/p Severe Iron deficiency with microcytic anemia related to heavy menstrual losses  PLAN: -Discussed pt labwork today, 06/09/2021;  -Lab results today 06/09/2021 of CBC w/diff pending.  Cmp wnl Ferritin 217 with iron saturation of 36% B12 wnl -no indication for additional IV Iron at this time. -conitnue po iron polysaccharide 150mg  po daily. -Recommended pt start a daily B-Complex vitamin. -Goal Ferritin >50, Goal Iron Sat >30 -Will see back in 6 months--if counts and iron labs stable we shall discharge patient.  FOLLOW UP: RTC with Dr 08/09/2021 with labs in 6 months   All of the patients questions were answered with apparent satisfaction. The patient knows to call the clinic with any problems, questions or concerns.  The total time spent in the appointment was 15 minutes and more than 50% was on counseling and direct patient cares.    08/09/2021 MD MS AAHIVMS [facility] Hematology/Oncology Physician [facility]  (Office):       [phone] (Work cell):  [phone] (Fax):           [phone]    I, [phone], am acting as scribe for Dr. [phone], MD.   .I have reviewed the above documentation for accuracy and completeness, and I agree with the above. [phone] MD

## 2021-06-09 ENCOUNTER — Inpatient Hospital Stay: Payer: 59 | Attending: Hematology

## 2021-06-09 ENCOUNTER — Other Ambulatory Visit: Payer: Self-pay

## 2021-06-09 ENCOUNTER — Inpatient Hospital Stay (HOSPITAL_BASED_OUTPATIENT_CLINIC_OR_DEPARTMENT_OTHER): Payer: 59 | Admitting: Hematology

## 2021-06-09 VITALS — BP 112/75 | HR 80 | Temp 98.3°F | Resp 16 | Ht 65.0 in | Wt 141.6 lb

## 2021-06-09 DIAGNOSIS — D509 Iron deficiency anemia, unspecified: Secondary | ICD-10-CM | POA: Insufficient documentation

## 2021-06-09 DIAGNOSIS — N920 Excessive and frequent menstruation with regular cycle: Secondary | ICD-10-CM | POA: Diagnosis not present

## 2021-06-09 DIAGNOSIS — Z79899 Other long term (current) drug therapy: Secondary | ICD-10-CM | POA: Insufficient documentation

## 2021-06-09 DIAGNOSIS — D500 Iron deficiency anemia secondary to blood loss (chronic): Secondary | ICD-10-CM | POA: Diagnosis not present

## 2021-06-09 DIAGNOSIS — R252 Cramp and spasm: Secondary | ICD-10-CM | POA: Insufficient documentation

## 2021-06-09 DIAGNOSIS — Z86018 Personal history of other benign neoplasm: Secondary | ICD-10-CM | POA: Insufficient documentation

## 2021-06-09 DIAGNOSIS — R110 Nausea: Secondary | ICD-10-CM | POA: Insufficient documentation

## 2021-06-09 DIAGNOSIS — R197 Diarrhea, unspecified: Secondary | ICD-10-CM | POA: Diagnosis not present

## 2021-06-09 LAB — IRON AND TIBC
Iron: 87 ug/dL (ref 41–142)
Saturation Ratios: 36 % (ref 21–57)
TIBC: 245 ug/dL (ref 236–444)
UIBC: 158 ug/dL (ref 120–384)

## 2021-06-09 LAB — CMP (CANCER CENTER ONLY)
ALT: 20 U/L (ref 0–44)
AST: 17 U/L (ref 15–41)
Albumin: 4 g/dL (ref 3.5–5.0)
Alkaline Phosphatase: 32 U/L — ABNORMAL LOW (ref 38–126)
Anion gap: 7 (ref 5–15)
BUN: 11 mg/dL (ref 6–20)
CO2: 22 mmol/L (ref 22–32)
Calcium: 8.9 mg/dL (ref 8.9–10.3)
Chloride: 110 mmol/L (ref 98–111)
Creatinine: 0.78 mg/dL (ref 0.44–1.00)
GFR, Estimated: >60 mL/min (ref >60)
Glucose, Bld: 84 mg/dL (ref 70–99)
Potassium: 3.8 mmol/L (ref 3.5–5.1)
Sodium: 139 mmol/L (ref 135–145)
Total Bilirubin: 0.3 mg/dL (ref 0.3–1.2)
Total Protein: 7.4 g/dL (ref 6.5–8.1)

## 2021-06-09 LAB — FERRITIN: Ferritin: 217 ng/mL (ref 11–307)

## 2021-06-09 LAB — VITAMIN B12: Vitamin B-12: 675 pg/mL (ref 180–914)

## 2021-06-09 MED ORDER — POLYSACCHARIDE IRON COMPLEX 150 MG PO CAPS: 150.0000 mg | ORAL_CAPSULE | Freq: Every day | ORAL | 5 refills | Status: DC

## 2021-06-20 ENCOUNTER — Encounter: Payer: Self-pay | Admitting: Hematology

## 2021-06-22 NOTE — Progress Notes (Signed)
Per Lab CBC not done due to pt being a difficult stick and they were unable to get anymore tubes of blood.  No CBC result loaded -- still says in process. Pllz call lab to upload CBC results into Epic (see above note)   Per Dr Candise Che: Contacted pt to let her know her iron levels look good. Continue po iron. Pt acknowledged and verbalized understanding.

## 2021-06-28 ENCOUNTER — Ambulatory Visit (HOSPITAL_COMMUNITY)
Admission: EM | Admit: 2021-06-28 | Discharge: 2021-06-28 | Disposition: A | Discharge status: Home / Self Care | Payer: 59

## 2021-06-28 ENCOUNTER — Other Ambulatory Visit: Payer: Self-pay

## 2021-06-28 DIAGNOSIS — Z4802 Encounter for removal of sutures: Secondary | ICD-10-CM | POA: Diagnosis not present

## 2021-06-28 NOTE — ED Triage Notes (Signed)
Pt in for suture removal  Denies any drainage, severe pain  Wound appears well healed & approximated

## 2021-06-28 NOTE — ED Notes (Signed)
10 sutures removed from left lower leg.

## 2021-08-24 ENCOUNTER — Other Ambulatory Visit: Payer: Self-pay

## 2021-08-24 ENCOUNTER — Ambulatory Visit (INDEPENDENT_AMBULATORY_CARE_PROVIDER_SITE_OTHER): Payer: 59 | Admitting: Cardiovascular Disease

## 2021-08-24 ENCOUNTER — Encounter (HOSPITAL_BASED_OUTPATIENT_CLINIC_OR_DEPARTMENT_OTHER): Payer: Self-pay | Admitting: Cardiovascular Disease

## 2021-08-24 DIAGNOSIS — R011 Cardiac murmur, unspecified: Secondary | ICD-10-CM | POA: Diagnosis not present

## 2021-08-24 DIAGNOSIS — R0789 Other chest pain: Secondary | ICD-10-CM | POA: Diagnosis not present

## 2021-08-24 NOTE — Assessment & Plan Note (Signed)
No evidence of heart failure on exam or by symptoms.  I do not hear a murmur on exam today.  She does have a history of iron deficiency anemia which has improved with iron infusions.  I suspect her murmur previously heard was more attributable to high flow and high output than valvular heart disease.  No plans for any further testing at this time.

## 2021-08-24 NOTE — Assessment & Plan Note (Addendum)
Symptoms are very atypical and not with exertion.  Her overall risk for obstructive CAD is low. It seems more attributable to stress and anxiety.  I did encourage her to start exercising at least 150 minutes/week.  If she starts to have exertional symptoms we will consider additional ischemic evaluation but not indicated at this time.  Continue working with her therapist.

## 2021-08-24 NOTE — Progress Notes (Signed)
Cardiology Office Note:    Date:  08/24/2021   ID:  Linda Yates, DOB Jul 15, 1991, MRN [id number]  PCP:  Dorothyann Peng, MD   [facility] Providers Cardiologist:  None     Referring MD: Dorothyann Peng, MD   No chief complaint on file.   History of Present Illness:    Linda Yates is a 30 y.o. female with a hx of allergies and anemia coming in today for evaluation of atypical chest pain. She was last seen by her PCP Dr. Allyne Gee on 05/19/2021 for a routine medical examination where she reported atypical chest pain possibly related to her anemia or stress and was referred to cardiology.  Today, she is doing well. Last year, her OB doctor noticed an murmur and she reported chest pain to her PCP 3 months ago. The pain is described as a heaviness and discomfort. She believes her chest pain may be related to her anxiety which is managed using deep breathing exercises and therapy. Her anxiety also causes her to feel like she is not breathing. The pain bothers her when she is resting but not during physical activity. She had leg cramps with her last episode being 08/2020. Magnesium and stretching helped with relieving the cramps. She used to walk 3 miles a day and felt better during this time. However, since she is busy with her part-time job, her exercise is limited to 1/2 a mile while walking her dog when she has time. She has never smoked and occasionally has a glass of wine. She denies any palpitations, lightheadedness, headaches, syncope, orthopnea, PND, lower extremity edema or exertional symptoms.   Past Medical History:  Diagnosis Date   Allergy    Anemia     History reviewed. No pertinent surgical history.  Current Medications: No outpatient medications have been marked as taking for the 08/24/21 encounter (Office Visit) with Chilton Si, MD.     Allergies:   Prednisone   Social History   Socioeconomic History   Marital status: Single    Spouse name: Not on file    Number of children: Not on file   Years of education: Not on file   Highest education level: Not on file  Occupational History   Not on file  Tobacco Use   Smoking status: Never   Smokeless tobacco: Never  Substance and Sexual Activity   Alcohol use: Never    Alcohol/week: 0.0 standard drinks   Drug use: Never   Sexual activity: Never  Other Topics Concern   Not on file  Social History Narrative   Not on file   Social Determinants of Health   Financial Resource Strain: Low Risk    Difficulty of Paying Living Expenses: Not hard at all  Food Insecurity: No Food Insecurity   Worried About Programme researcher, broadcasting/film/video in the Last Year: Never true   Ran Out of Food in the Last Year: Never true  Transportation Needs: No Transportation Needs   Lack of Transportation (Medical): No   Lack of Transportation (Non-Medical): No  Physical Activity: Inactive   Days of Exercise per Week: 0 days   Minutes of Exercise per Session: 0 min  Stress: Not on file  Social Connections: Not on file     Family History: The patient's family history includes Autoimmune disease in her maternal grandmother; Congestive Heart Failure in her maternal grandmother; Diabetes in her maternal grandmother; Hyperlipidemia in her maternal grandmother; Hypertension in her maternal grandmother; Hyperthyroidism in her maternal grandmother and  mother; Kidney disease in her maternal grandmother; Stroke in her maternal grandmother.  ROS:   Please see the history of present illness.    (+) Chest discomfort All other systems reviewed and are negative.  EKGs/Labs/Other Studies Reviewed:    The following studies were reviewed today: No previous cardiovascular sudies  EKG:   08/28/2021: Sinus rhythm Rate 74 bpm  Recent Labs: 05/19/2021: Hemoglobin 11.6; Platelets 295 06/09/2021: ALT 20; BUN 11; Creatinine 0.78; Potassium 3.8; Sodium 139  Recent Lipid Panel    Component Value Date/Time   CHOL 171 05/19/2021 1716   TRIG 24  05/19/2021 1716   HDL 71 05/19/2021 1716   CHOLHDL 2.4 05/19/2021 1716   LDLCALC 94 05/19/2021 1716        Physical Exam:    VS:  BP 96/62   Pulse 74   Ht 5\' 5"  (1.651 m)   Wt 141 lb 6.4 oz (64.1 kg)   BMI 23.53 kg/m  , BMI Body mass index is 23.53 kg/m. GENERAL:  Well appearing HEENT: Pupils equal round and reactive, fundi not visualized, oral mucosa unremarkable NECK:  No jugular venous distention, waveform within normal limits, carotid upstroke brisk and symmetric, no bruits, no thyromegaly LUNGS:  Clear to auscultation bilaterally HEART:  RRR.  PMI not displaced or sustained,S1 and S2 within normal limits, no S3, no S4, no clicks, no rubs, no murmurs ABD:  Flat, positive bowel sounds normal in frequency in pitch, no bruits, no rebound, no guarding, no midline pulsatile mass, no hepatomegaly, no splenomegaly EXT:  2 plus pulses throughout, no edema, no cyanosis no clubbing SKIN:  No rashes no nodules NEURO:  Cranial nerves II through XII grossly intact, motor grossly intact throughout PSYCH:  Cognitively intact, oriented to person place and time   ASSESSMENT:    1. Atypical chest pain   2. Systolic murmur    PLAN:   Atypical chest pain Symptoms are very atypical and not with exertion.  Her overall risk for obstructive CAD is low. It seems more attributable to stress and anxiety.  I did encourage her to start exercising at least 150 minutes/week.  If she starts to have exertional symptoms we will consider additional ischemic evaluation but not indicated at this time.  Continue working with her therapist.  Systolic murmur No evidence of heart failure on exam or by symptoms.  I do not hear a murmur on exam today.  She does have a history of iron deficiency anemia which has improved with iron infusions.  I suspect her murmur previously heard was more attributable to high flow and high output than valvular heart disease.  No plans for any further testing at this  time.    Medication Adjustments/Labs and Tests Ordered: Current medicines are reviewed at length with the patient today.  Concerns regarding medicines are outlined above.  No orders of the defined types were placed in this encounter.  No orders of the defined types were placed in this encounter.   Patient Instructions  Medication Instructions:  NO CHANGES   Labwork: NONE   Testing/Procedures: NONE   Follow-Up: AS NEEDED    Disposition: FU with Linda Yates C. , MD, [facility] as needed   [facility] as a scribe for Sherin Quarry, MD.,have documented all relevant documentation on the behalf of Chilton Si, MD,as directed by  Chilton Si, MD while in the presence of Chilton Si, MD.  I, Allani Reber C. Chilton Si, MD have reviewed all documentation for this visit.  The documentation of the  exam, diagnosis, procedures, and orders on 08/24/2021 are all accurate and complete.   Cipriano Mile  08/24/2021 2:19 PM    [facility]

## 2021-08-24 NOTE — Patient Instructions (Addendum)
Medication Instructions:  NO CHANGES   Labwork: NONE   Testing/Procedures: NONE   Follow-Up: AS NEEDED

## 2021-09-09 ENCOUNTER — Other Ambulatory Visit: Payer: Self-pay | Admitting: Obstetrics & Gynecology

## 2021-09-13 ENCOUNTER — Ambulatory Visit
Admission: EM | Admit: 2021-09-13 | Discharge: 2021-09-13 | Disposition: A | Discharge status: Home / Self Care | Payer: 59 | Attending: Emergency Medicine | Admitting: Emergency Medicine

## 2021-09-13 ENCOUNTER — Other Ambulatory Visit: Payer: Self-pay

## 2021-09-13 DIAGNOSIS — S96911A Strain of unspecified muscle and tendon at ankle and foot level, right foot, initial encounter: Secondary | ICD-10-CM | POA: Diagnosis not present

## 2021-09-13 MED ORDER — NAPROXEN 500 MG PO TABS: 500.0000 mg | ORAL_TABLET | Freq: Two times a day (BID) | ORAL | 0 refills | Status: DC

## 2021-09-13 NOTE — Discharge Instructions (Addendum)
Please wear the stabilizing brace with the plan to bear weight on the foot which you should try to do as infrequently as possible.  I provided you with a prescription for anti-inflammatory pain medication, please take 1 tablet twice daily as needed.  Please apply ice to your ankle as often as you can, at least 4 times daily for 20 minutes each time.  Please follow-up with your primary care provider in the next 3 to 5 days, notify them that you were seen in urgent care today.  As we discussed, should you decide to have an x-ray done of your ankle, please feel free to go to the main entrance of the [facility] location, address provided for you the end of this packet, to let them know that you have an x-ray ordered.  If you decide to go after hours, please go to the emergency room entrance instead of the main entrance.

## 2021-09-13 NOTE — ED Provider Notes (Addendum)
[facility]    CSN: [id number] Arrival date & time: 09/13/21  [id number]      History   Chief Complaint Chief Complaint  Patient presents with   Ankle Pain    right    HPI Linda Yates is a 30 y.o. female.   Patient states that 5 days ago, while wearing platform boots and chasing her puppy. Pt states she fell forward and hyperextended her right foot causing pain in her right ankle. Pt notes that pain was minimal at the onset but 2 days later the pain significantly increased. Pain increases with walking and applying pressure.  Patient states she has not noticed any swelling, denies tenderness to the touch and bruising.  Patient states she has tried ice, elevation and naproxen with some relief.  Denies loss of consciousness with fall.  The history is provided by the patient.   Past Medical History:  Diagnosis Date   Allergy    Anemia     Patient Active Problem List   Diagnosis Date Noted   Atypical chest pain [id number]   Systolic murmur [id number]   Iron deficiency anemia due to chronic blood loss 04/29/2020   Allergic rhinitis due to pollen 02/10/2015    History reviewed. No pertinent surgical history.  OB History     Gravida  0   Para  0   Term  0   Preterm  0   AB  0   Living  0      SAB  0   IAB  0   Ectopic  0   Multiple  0   Live Births  0            Home Medications    Prior to Admission medications   Medication Sig Start Date End Date Taking? Authorizing Provider  naproxen (NAPROSYN) 500 MG tablet Take 1 tablet (500 mg total) by mouth 2 (two) times daily. 09/13/21  Yes Lynden Oxford Scales, PA-C  iron polysaccharides (NIFEREX) 150 MG capsule Take 1 capsule (150 mg total) by mouth daily. 06/09/21   Brunetta Genera, MD  tranexamic acid (LYSTEDA) 650 MG TABS tablet TAKE 2 TABS BY MOUTH 3 TIMES A DAY FOR UP TO 5 DAYS EACH CYCLE. STOP WHEN BLEEDING STARTS TO IMPROVE 09/09/21   Megan Salon, MD    Family History Family  History  Problem Relation Age of Onset   Hyperthyroidism Mother    Autoimmune disease Maternal Grandmother        antiphospholipid ab syndrome   Hypertension Maternal Grandmother    Diabetes Maternal Grandmother    Congestive Heart Failure Maternal Grandmother    Kidney disease Maternal Grandmother    Stroke Maternal Grandmother    Hyperlipidemia Maternal Grandmother    Hyperthyroidism Maternal Grandmother     Social History Social History   Tobacco Use   Smoking status: Never   Smokeless tobacco: Never  Substance Use Topics   Alcohol use: Never    Alcohol/week: 0.0 standard drinks   Drug use: Never     Allergies   Prednisone   Review of Systems Review of Systems Pertinent findings noted in history of present illness.    Physical Exam Triage Vital Signs ED Triage Vitals  Enc Vitals Group     BP 09/03/21 0827 (!) 147/82     Pulse Rate 09/03/21 0827 72     Resp 09/03/21 0827 18     Temp 09/03/21 0827 98.3 F (36.8 C)  Temp Source 09/03/21 0827 Oral     SpO2 09/03/21 0827 98 %     Weight --      Height --      Head Circumference --      Peak Flow --      Pain Score 09/03/21 0826 5     Pain Loc --      Pain Edu? --      Excl. in GC? --    No data found.  Updated Vital Signs BP 112/74 (BP Location: Right Arm)   Pulse 70   Temp 98.1 F (36.7 C) (Oral)   Resp 18   SpO2 98%   Visual Acuity Right Eye Distance:   Left Eye Distance:   Bilateral Distance:    Right Eye Near:   Left Eye Near:    Bilateral Near:     Physical Exam   UC Treatments / Results  Labs (all labs ordered are listed, but only abnormal results are displayed) Labs Reviewed - No data to display  EKG   Radiology No results found.  Procedures Procedures (including critical care time)  Medications Ordered in UC Medications - No data to display  Initial Impression / Assessment and Plan / UC Course  I have reviewed the triage vital signs and the nursing  notes.  Pertinent labs & imaging results that were available during my care of the patient were reviewed by me and considered in my medical decision making (see chart for details).     Patient advised that based on physical exam finding is more than likely that she has hyperextended the tendon of across the front of her right ankle and it is unlikely that she has ruptured the tendon or had any trauma to any of her bones in her right foot.  This being said, I advised patient that I was happy to order an x-ray of her right foot for reassurance.  Patient states she will consider having this done.  Conservative care recommended, patient was placed in an ASO stabilizing ankle brace.  Return precautions advised.  Patient verbalized understanding and agreement of plan as discussed.  All questions were addressed during visit.  Please see discharge instructions below for further details of plan.  Final Clinical Impressions(s) / UC Diagnoses   Final diagnoses:  Strain of right ankle, initial encounter     Discharge Instructions      Please wear the stabilizing brace with the plan to bear weight on the foot which you should try to do as infrequently as possible.  I provided you with a prescription for anti-inflammatory pain medication, please take 1 tablet twice daily as needed.  Please apply ice to your ankle as often as you can, at least 4 times daily for 20 minutes each time.  Please follow-up with your primary care provider in the next 3 to 5 days, notify them that you were seen in urgent care today.  As we discussed, should you decide to have an x-ray done of your ankle, please feel free to go to the main entrance of the [facility] location, address provided for you the end of this packet, to let them know that you have an x-ray ordered.  If you decide to go after hours, please go to the emergency room entrance instead of the main entrance.     ED Prescriptions     Medication  Sig Dispense Auth. Provider   naproxen (NAPROSYN) 500 MG tablet Take 1 tablet (500 mg  total) by mouth 2 (two) times daily. 30 tablet Lynden Oxford Scales, PA-C      PDMP not reviewed this encounter.   Disposition Upon Discharge:   The patient will follow up with their current PCP if and as advised. If the patient does not currently have a PCP we will assist them in obtaining one.   The patient may need specialty follow up if the symptoms continue, in spite of conservative treatment and management, for further workup, evaluation, consultation and treatment as clinically indicated and appropriate.  If patient was tested for COVID-19, Influenza and/or RSV, the patient/parent/guardian was advised to isolate at home pending the results of his/her diagnostic coronavirus test and potentially longer if they're positive. Advised pt that if his/her COVID-19 test returns positive, it's recommended to self-isolate for at least 10 days after symptoms first appeared AND until fever-free for 24 hours without fever reducer AND other symptoms have improved or resolved. Discussed self-isolation recommendations as well as instructions for household member/close contacts as per the [facility] and  DHHS, and also gave patient the [place] packet with this information.  Return to the [facility] or PCP in 3-5 days if no better; to PCP or the Emergency Department if new signs and symptoms develop, or if the current signs or symptoms continue to change or worsen for further workup, evaluation and treatment as clinically indicated and appropriate  Condition: stable for discharge home Home: take medications as prescribed; routine discharge instructions as discussed; follow up as advised.    Lynden Oxford Scales, PA-C 09/13/21 2104    Lynden Oxford Scales, PA-C 09/13/21 2131

## 2021-09-13 NOTE — ED Triage Notes (Signed)
5 days ago, while wearing platform boots and chasing her puppy Pt fell causing pain in her right ankle. Pt notes that pain was minimal at the onset but 3 days later the pain significantly increased. Pain increases with walking and applying pressure.  Denies swelling, tenderness to the touch and bruising. Has tried ice, elevation and naproxen with some relief. No LOC.

## 2021-09-16 ENCOUNTER — Other Ambulatory Visit: Payer: Self-pay

## 2021-09-16 ENCOUNTER — Ambulatory Visit (HOSPITAL_BASED_OUTPATIENT_CLINIC_OR_DEPARTMENT_OTHER)
Admission: RE | Admit: 2021-09-16 | Discharge: 2021-09-16 | Disposition: A | Discharge status: Home / Self Care | Payer: 59 | Source: Ambulatory Visit | Attending: Emergency Medicine | Admitting: Emergency Medicine

## 2021-09-16 DIAGNOSIS — W19XXXA Unspecified fall, initial encounter: Secondary | ICD-10-CM | POA: Insufficient documentation

## 2021-09-16 DIAGNOSIS — S99911A Unspecified injury of right ankle, initial encounter: Secondary | ICD-10-CM | POA: Diagnosis not present

## 2021-11-23 ENCOUNTER — Other Ambulatory Visit: Payer: Self-pay

## 2021-11-23 DIAGNOSIS — D500 Iron deficiency anemia secondary to blood loss (chronic): Secondary | ICD-10-CM

## 2021-11-23 MED ORDER — POLYSACCHARIDE IRON COMPLEX 150 MG PO CAPS: 150.0000 mg | ORAL_CAPSULE | Freq: Every day | ORAL | 0 refills | Status: AC

## 2021-11-26 ENCOUNTER — Encounter (HOSPITAL_BASED_OUTPATIENT_CLINIC_OR_DEPARTMENT_OTHER): Payer: Self-pay | Admitting: Obstetrics & Gynecology

## 2021-11-26 ENCOUNTER — Ambulatory Visit (INDEPENDENT_AMBULATORY_CARE_PROVIDER_SITE_OTHER): Payer: 59 | Admitting: Obstetrics & Gynecology

## 2021-11-26 ENCOUNTER — Other Ambulatory Visit: Payer: Self-pay

## 2021-11-26 ENCOUNTER — Other Ambulatory Visit (HOSPITAL_COMMUNITY)
Admission: RE | Admit: 2021-11-26 | Discharge: 2021-11-26 | Disposition: A | Discharge status: Home / Self Care | Payer: 59 | Source: Ambulatory Visit | Attending: Obstetrics & Gynecology | Admitting: Obstetrics & Gynecology

## 2021-11-26 VITALS — BP 121/75 | HR 75 | Ht 64.5 in | Wt 132.0 lb

## 2021-11-26 DIAGNOSIS — N920 Excessive and frequent menstruation with regular cycle: Secondary | ICD-10-CM

## 2021-11-26 DIAGNOSIS — Z01419 Encounter for gynecological examination (general) (routine) without abnormal findings: Secondary | ICD-10-CM

## 2021-11-26 DIAGNOSIS — D500 Iron deficiency anemia secondary to blood loss (chronic): Secondary | ICD-10-CM

## 2021-11-26 DIAGNOSIS — R300 Dysuria: Secondary | ICD-10-CM

## 2021-11-26 LAB — POCT URINALYSIS DIPSTICK
Bilirubin, UA: NEGATIVE
Glucose, UA: NEGATIVE
Ketones, UA: NEGATIVE
Nitrite, UA: NEGATIVE
Odor: NEGATIVE
Protein, UA: NEGATIVE
Spec Grav, UA: 1.02 (ref 1.010–1.025)
Urobilinogen, UA: 0.2 E.U./dL
pH, UA: 8.5 — AB (ref 5.0–8.0)

## 2021-11-26 MED ORDER — SULFAMETHOXAZOLE-TRIMETHOPRIM 800-160 MG PO TABS: 1.0000 | ORAL_TABLET | Freq: Two times a day (BID) | ORAL | 0 refills | Status: AC

## 2021-11-26 MED ORDER — TRANEXAMIC ACID 650 MG PO TABS: ORAL_TABLET | ORAL | 3 refills | Status: AC

## 2021-11-26 NOTE — Progress Notes (Signed)
31 y.o. [place] female here for annual exam.  Doing well.  Cycles are still regular and heavy but lysteda has helped.  Does not want any hormonal therapy.  Not SA.  Has seen hematology.  Had iron infusions.  Hb was 11.6 at last visit.  Ferritin was much improved.  She is on oral iron daily now.  Was treated for BV x 1 since I saw her.  She is having a little irritation and pelvic pressure for about 24 hours.  Dip urine with trace WBCs/RBCs.  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  Leaves in 10 days.  Very excited about move and job opportunity.  Patient's last menstrual period was 11/08/2021 (exact date).          Sexually active: No.  The current method of family planning is abstinence.     Upstream - 11/26/21 1204       Pregnancy Intention Screening   Does the patient want to become pregnant in the next year? No    Does the patient's partner want to become pregnant in the next year? N/A    Would the patient like to discuss contraceptive options today? No            Exercising: No.  Smoker:  no  Health Maintenance: Pap:  04/2020 History of abnormal Pap:  no MMG:  guidelines reviewed Colonoscopy:  guidelines reviewed Screening Labs: done with hematology in August.  Reviewed with pt.   reports that she has never smoked. She has never used smokeless tobacco. She reports current alcohol use. She reports that she does not use drugs.  Past Medical History:  Diagnosis Date   Allergy    Anemia     History reviewed. No pertinent surgical history.  Current Outpatient Medications  Medication Sig Dispense Refill   iron polysaccharides (NIFEREX) 150 MG capsule Take 1 capsule (150 mg total) by mouth daily. 90 capsule 0   tranexamic acid (LYSTEDA) 650 MG TABS tablet TAKE 2 TABS BY MOUTH 3 TIMES A DAY FOR UP TO 5 DAYS EACH CYCLE. STOP WHEN BLEEDING STARTS TO IMPROVE 30 tablet 1   No current facility-administered medications  for this visit.    Family History  Problem Relation Age of Onset   Autoimmune disease Maternal Grandmother        antiphospholipid ab syndrome   Hypertension Maternal Grandmother    Diabetes Maternal Grandmother    Congestive Heart Failure Maternal Grandmother    Kidney disease Maternal Grandmother    Stroke Maternal Grandmother    Hyperlipidemia Maternal Grandmother    Hyperthyroidism Maternal Grandmother    Hyperthyroidism Mother     Review of Systems  All other systems reviewed and are negative.  Exam:   BP 121/75    Pulse 75    Ht 5' 4.5" (1.638 m)    Wt 132 lb (59.9 kg)    LMP 11/08/2021 (Exact Date)    BMI 22.31 kg/m   Height: 5' 4.5" (163.8 cm)  General appearance: alert, cooperative and appears stated age Head: Normocephalic, without obvious abnormality, atraumatic Neck: no adenopathy, supple, symmetrical, trachea midline and thyroid normal to inspection and palpation Lungs: clear to auscultation bilaterally Breasts: normal appearance, no masses or tenderness Heart: regular rate and rhythm Abdomen: soft, non-tender; bowel sounds normal; no masses,  no organomegaly Extremities: extremities normal, atraumatic, no cyanosis or edema Skin: Skin color, texture, turgor normal. No rashes or lesions Lymph nodes: Cervical,  supraclavicular, and axillary nodes normal. No abnormal inguinal nodes palpated Neurologic: Grossly normal   Pelvic: External genitalia:  no lesions and normal skin appearance with slightly darker pigmentation of external labia majora              Urethra:  normal appearing urethra with no masses, tenderness or lesions              Bartholins and Skenes: normal                 Vagina: normal appearing vagina with normal color, scant discharge noted, no lesions              Cervix: declines speculum exam              Pap taken: No. Bimanual Exam:  Uterus:  about 8 weeks, nodular, stable from prior exam              Adnexa: normal adnexa and no mass,  fullness, tenderness               Rectovaginal: Confirms               Anus:  normal sphincter tone, no lesions  Chaperone, Ezekiel Ina, RN, was present for exam.  Assessment/Plan: 1. Well woman exam with routine gynecological exam - neg pap with neg HR HPV 04/2020.  Not indicated today. - breast and colon cancer screening guidelines reviewed - Gc/Chl testing not indicated  2. Burning with urination - POCT urinalysis dipstick - Cervicovaginal ancillary only( [place]) - as is Friday afternoon, will treat with bactrim DS BID x 3 days  3. Menorrhagia with regular cycle - possible fibroids.  Do not feel there is any need for imaging at this time - bleeding improved with lysteda.  Rx for tid x 5 day dosing with RFs to pharmacy.  Pt may need rx transferred once she moves to [facility].  Advised to call or send Mychart message if needed  4. Iron deficiency anemia due to chronic blood loss - improved with iron infusions and oral iron.  Will continue to take daily iron.

## 2021-11-29 ENCOUNTER — Ambulatory Visit (HOSPITAL_BASED_OUTPATIENT_CLINIC_OR_DEPARTMENT_OTHER): Payer: 59 | Admitting: Obstetrics & Gynecology

## 2021-11-29 LAB — CERVICOVAGINAL ANCILLARY ONLY
Bacterial Vaginitis (gardnerella): NEGATIVE
Candida Glabrata: NEGATIVE
Candida Vaginitis: POSITIVE — AB
Comment: NEGATIVE
Comment: NEGATIVE
Comment: NEGATIVE

## 2021-12-03 MED ORDER — FLUCONAZOLE 150 MG PO TABS: 150.0000 mg | ORAL_TABLET | Freq: Once | ORAL | 0 refills | Status: AC

## 2021-12-03 NOTE — Addendum Note (Signed)
Addended by: Jerene Bears on: 12/03/2021 06:09 AM   Modules accepted: Orders

## 2021-12-15 ENCOUNTER — Inpatient Hospital Stay: Payer: 59

## 2021-12-15 ENCOUNTER — Inpatient Hospital Stay: Payer: 59 | Admitting: Hematology

## 2022-05-25 ENCOUNTER — Encounter: Payer: 59 | Admitting: Internal Medicine

## 2023-01-07 IMAGING — DX DG ANKLE COMPLETE 3+V*R*
3 series · 3 of 3 positions shown · non-contrast
Comparison: 02/05/2018

CLINICAL DATA: Hyperextension injury of the right ankle. Fell
yesterday.

EXAM:
RIGHT ANKLE - COMPLETE 3+ VIEW

[ankle ap]
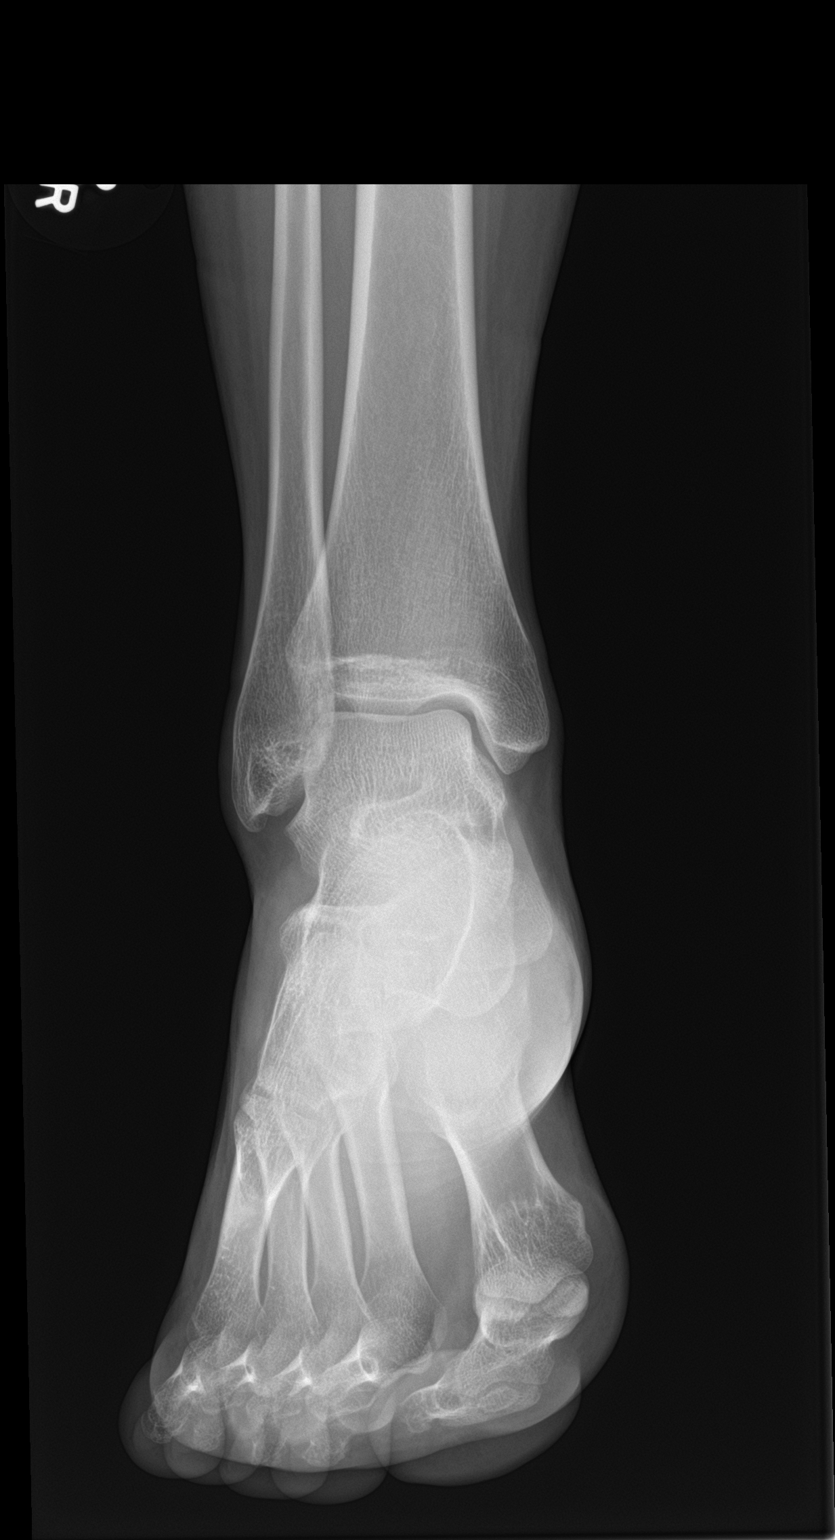

[ankle obl]
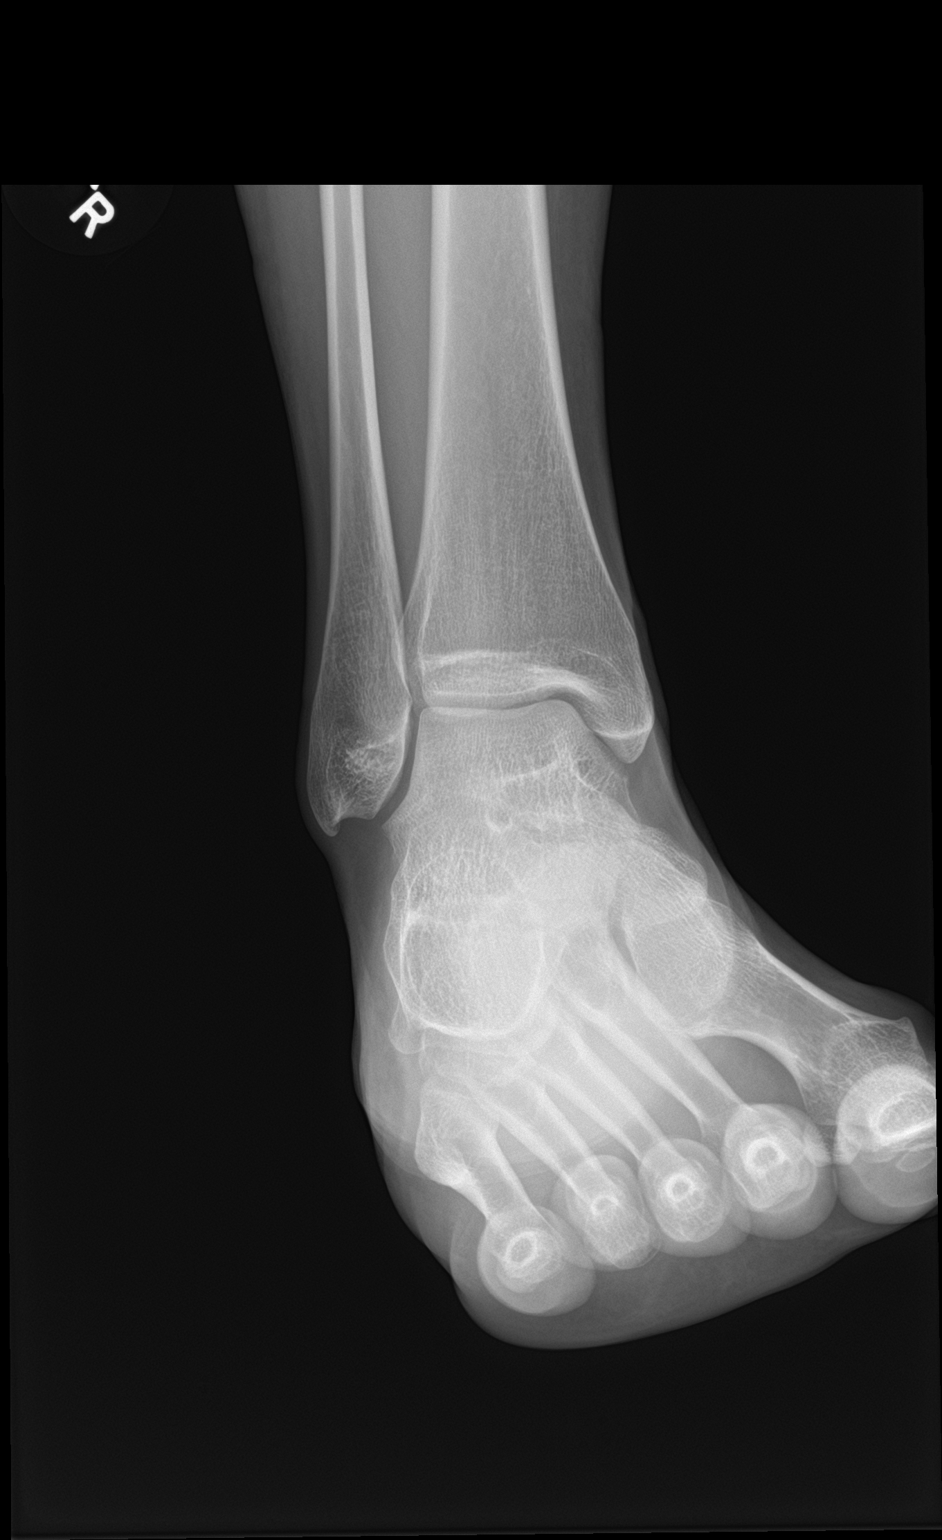

[ankle lat]
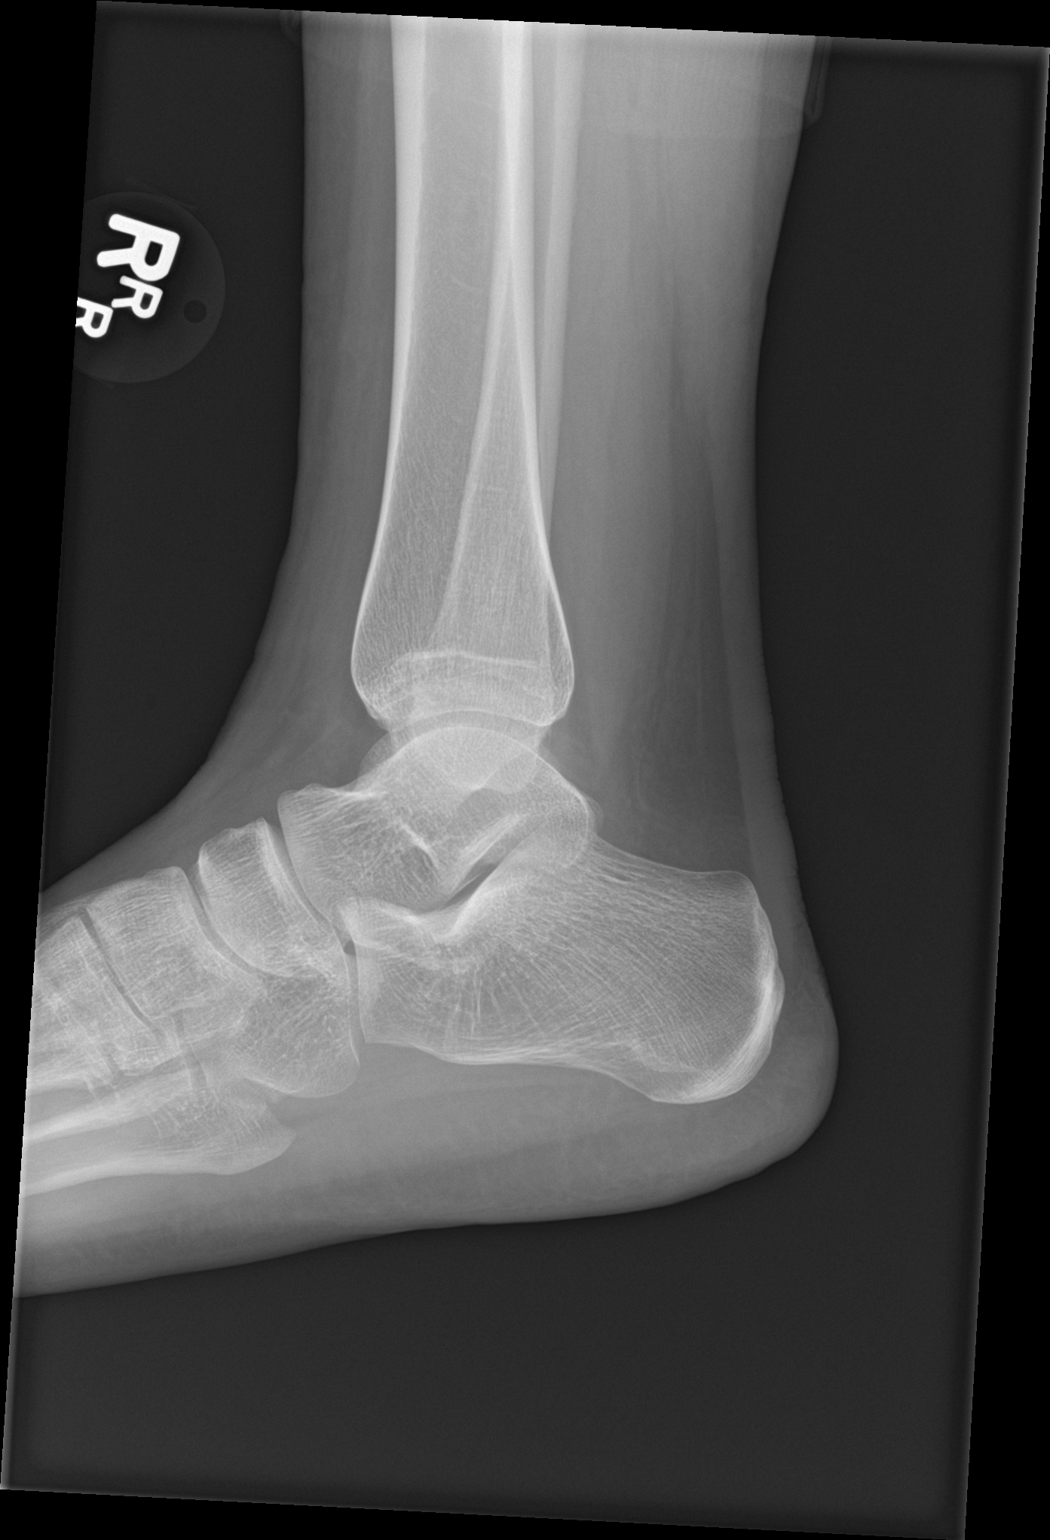

[3 of 3 positions shown; findings below may reference images not displayed]

FINDINGS: The ankle mortise is maintained. No acute ankle fracture is
identified. No definite ankle joint effusion. The subtalar joints
are normal. The mid and hindfoot bony structures are intact.
IMPRESSION: No acute bony findings.

## 2023-11-10 ENCOUNTER — Telehealth (HOSPITAL_BASED_OUTPATIENT_CLINIC_OR_DEPARTMENT_OTHER): Payer: Self-pay

## 2023-11-10 NOTE — Telephone Encounter (Signed)
 Pt was called to schedule annual exam. Pt no longer lives in [place] and she no longer need our services.
